# Patient Record
Sex: Male | Born: 1975 | Race: White | Hispanic: No | Marital: Single | Smoking: Former smoker
Health system: Southern US, Community
[De-identification: ages and names within clinical notes are randomized; demographics above are authoritative.]

## PROBLEM LIST (undated history)

## (undated) DIAGNOSIS — T7840XA Allergy, unspecified, initial encounter: Secondary | ICD-10-CM

## (undated) DIAGNOSIS — J45909 Unspecified asthma, uncomplicated: Secondary | ICD-10-CM

## (undated) DIAGNOSIS — J40 Bronchitis, not specified as acute or chronic: Secondary | ICD-10-CM

## (undated) DIAGNOSIS — J209 Acute bronchitis, unspecified: Secondary | ICD-10-CM

## (undated) HISTORY — DX: Unspecified asthma, uncomplicated: J45.909

## (undated) HISTORY — PX: DENTAL SURGERY: SHX609

## (undated) HISTORY — DX: Allergy, unspecified, initial encounter: T78.40XA

---

## 2008-11-12 ENCOUNTER — Emergency Department (HOSPITAL_COMMUNITY): Admission: EM | Admit: 2008-11-12 | Discharge: 2008-11-12 | Payer: Self-pay | Admitting: Emergency Medicine

## 2008-11-16 ENCOUNTER — Emergency Department (HOSPITAL_COMMUNITY): Admission: EM | Admit: 2008-11-16 | Discharge: 2008-11-16 | Payer: Self-pay | Admitting: Emergency Medicine

## 2008-12-06 ENCOUNTER — Emergency Department (HOSPITAL_COMMUNITY): Admission: EM | Admit: 2008-12-06 | Discharge: 2008-12-06 | Payer: Self-pay | Admitting: Emergency Medicine

## 2009-01-12 ENCOUNTER — Emergency Department: Payer: Self-pay | Admitting: Emergency Medicine

## 2009-07-07 ENCOUNTER — Emergency Department (HOSPITAL_BASED_OUTPATIENT_CLINIC_OR_DEPARTMENT_OTHER): Admission: EM | Admit: 2009-07-07 | Discharge: 2009-07-07 | Payer: Self-pay | Admitting: Emergency Medicine

## 2009-08-01 ENCOUNTER — Emergency Department (HOSPITAL_COMMUNITY): Admission: EM | Admit: 2009-08-01 | Discharge: 2009-08-01 | Payer: Self-pay | Admitting: Emergency Medicine

## 2009-08-30 ENCOUNTER — Emergency Department (HOSPITAL_COMMUNITY): Admission: EM | Admit: 2009-08-30 | Discharge: 2009-08-30 | Payer: Self-pay | Admitting: Emergency Medicine

## 2009-09-14 ENCOUNTER — Emergency Department (HOSPITAL_BASED_OUTPATIENT_CLINIC_OR_DEPARTMENT_OTHER): Admission: EM | Admit: 2009-09-14 | Discharge: 2009-09-14 | Payer: Self-pay | Admitting: Emergency Medicine

## 2010-12-13 LAB — URINE MICROSCOPIC-ADD ON

## 2010-12-13 LAB — URINALYSIS, ROUTINE W REFLEX MICROSCOPIC
Bilirubin Urine: NEGATIVE
Glucose, UA: NEGATIVE mg/dL
Ketones, ur: NEGATIVE mg/dL
Nitrite: NEGATIVE
Protein, ur: NEGATIVE mg/dL
pH: 8 (ref 5.0–8.0)

## 2010-12-13 LAB — RAPID URINE DRUG SCREEN, HOSP PERFORMED
Benzodiazepines: NOT DETECTED
Cocaine: POSITIVE — AB
Opiates: NOT DETECTED
Tetrahydrocannabinol: POSITIVE — AB

## 2011-02-16 ENCOUNTER — Emergency Department: Payer: Self-pay | Admitting: Emergency Medicine

## 2011-03-11 ENCOUNTER — Emergency Department: Payer: Self-pay | Admitting: Emergency Medicine

## 2011-05-15 ENCOUNTER — Emergency Department: Payer: Self-pay | Admitting: Emergency Medicine

## 2011-07-02 ENCOUNTER — Emergency Department (HOSPITAL_COMMUNITY): Payer: Self-pay

## 2011-07-02 ENCOUNTER — Emergency Department (HOSPITAL_COMMUNITY)
Admission: EM | Admit: 2011-07-02 | Discharge: 2011-07-02 | Disposition: A | Discharge status: Home / Self Care | Payer: Self-pay | Attending: Emergency Medicine | Admitting: Emergency Medicine

## 2011-07-02 DIAGNOSIS — M533 Sacrococcygeal disorders, not elsewhere classified: Secondary | ICD-10-CM | POA: Insufficient documentation

## 2011-07-02 DIAGNOSIS — T148XXA Other injury of unspecified body region, initial encounter: Secondary | ICD-10-CM | POA: Insufficient documentation

## 2011-07-02 DIAGNOSIS — W108XXA Fall (on) (from) other stairs and steps, initial encounter: Secondary | ICD-10-CM | POA: Insufficient documentation

## 2011-07-02 DIAGNOSIS — M545 Low back pain, unspecified: Secondary | ICD-10-CM | POA: Insufficient documentation

## 2013-11-17 ENCOUNTER — Emergency Department: Payer: Self-pay | Admitting: Emergency Medicine

## 2018-04-09 ENCOUNTER — Emergency Department (HOSPITAL_COMMUNITY)
Admission: EM | Admit: 2018-04-09 | Discharge: 2018-04-09 | Disposition: A | Discharge status: Home / Self Care | Payer: Self-pay | Attending: Emergency Medicine | Admitting: Emergency Medicine

## 2018-04-09 ENCOUNTER — Encounter (HOSPITAL_COMMUNITY): Payer: Self-pay

## 2018-04-09 ENCOUNTER — Other Ambulatory Visit: Payer: Self-pay

## 2018-04-09 ENCOUNTER — Emergency Department (HOSPITAL_COMMUNITY): Payer: Self-pay

## 2018-04-09 DIAGNOSIS — Z79899 Other long term (current) drug therapy: Secondary | ICD-10-CM | POA: Insufficient documentation

## 2018-04-09 DIAGNOSIS — J069 Acute upper respiratory infection, unspecified: Secondary | ICD-10-CM | POA: Insufficient documentation

## 2018-04-09 DIAGNOSIS — B9789 Other viral agents as the cause of diseases classified elsewhere: Secondary | ICD-10-CM | POA: Insufficient documentation

## 2018-04-09 DIAGNOSIS — J9801 Acute bronchospasm: Secondary | ICD-10-CM | POA: Insufficient documentation

## 2018-04-09 DIAGNOSIS — Z87891 Personal history of nicotine dependence: Secondary | ICD-10-CM | POA: Insufficient documentation

## 2018-04-09 HISTORY — DX: Acute bronchitis, unspecified: J20.9

## 2018-04-09 MED ORDER — ALBUTEROL SULFATE (2.5 MG/3ML) 0.083% IN NEBU: 2.5000 mg | INHALATION_SOLUTION | Freq: Four times a day (QID) | RESPIRATORY_TRACT | 0 refills | Status: DC | PRN

## 2018-04-09 MED ORDER — ALBUTEROL SULFATE (2.5 MG/3ML) 0.083% IN NEBU: 5.0000 mg | INHALATION_SOLUTION | Freq: Once | RESPIRATORY_TRACT | Status: DC

## 2018-04-09 MED ORDER — IPRATROPIUM-ALBUTEROL 0.5-2.5 (3) MG/3ML IN SOLN
3.0000 mL | Freq: Once | RESPIRATORY_TRACT | Status: AC
  Administered 2018-04-09: 3 mL via RESPIRATORY_TRACT
  Filled 2018-04-09: qty 3

## 2018-04-09 MED ORDER — GUAIFENESIN-DM 100-10 MG/5ML PO SYRP: 5.0000 mL | ORAL_SOLUTION | ORAL | 0 refills | Status: DC | PRN

## 2018-04-09 MED ORDER — ALBUTEROL SULFATE (2.5 MG/3ML) 0.083% IN NEBU
2.5000 mg | INHALATION_SOLUTION | Freq: Once | RESPIRATORY_TRACT | Status: AC
  Administered 2018-04-09: 2.5 mg via RESPIRATORY_TRACT
  Filled 2018-04-09: qty 3

## 2018-04-09 MED ORDER — ALBUTEROL SULFATE HFA 108 (90 BASE) MCG/ACT IN AERS
2.0000 | INHALATION_SPRAY | RESPIRATORY_TRACT | Status: DC | PRN
  Administered 2018-04-09: 2 via RESPIRATORY_TRACT
  Filled 2018-04-09: qty 6.7

## 2018-04-09 MED ORDER — PREDNISONE 20 MG PO TABS: 40.0000 mg | ORAL_TABLET | Freq: Every day | ORAL | 0 refills | Status: DC

## 2018-04-09 MED ORDER — IPRATROPIUM BROMIDE 0.02 % IN SOLN: 0.5000 mg | Freq: Once | RESPIRATORY_TRACT | Status: DC

## 2018-04-09 NOTE — ED Triage Notes (Signed)
Pt has been to urgent care and has been havign wheezing for 2 weeks. Pt has finished azithromycin, prednisone, prescription strength robitussin, tessalon, and an inhaler. States it is not getting any better. Pt has inspiratory and expiratory wheezes throughout. NAD.

## 2018-04-09 NOTE — ED Provider Notes (Signed)
[facility] EMERGENCY DEPARTMENT Provider Note   CSN: [id number] Arrival date & time: 04/09/18  1637     History   Chief Complaint Chief Complaint  Patient presents with  . Wheezing    HPI Edwin Nguyen is a 42 y.o. male.  HPI Patient presents with shortness of breath and wheezing.  Has had over the last month.  Has had steroids albuterol cough medicine and azithromycin around 2 weeks ago.  Had been a little better at times but then worsened again.  No fevers or chills.  Has a cough with some chest pain.  No sputum production. Past Medical History:  Diagnosis Date  . Acute bronchitis     There are no active problems to display for this patient.   History reviewed. No pertinent surgical history.      Home Medications    Prior to Admission medications   Medication Sig Start Date End Date Taking? Authorizing Provider  albuterol (PROVENTIL) (2.5 MG/3ML) 0.083% nebulizer solution Take 3 mLs (2.5 mg total) by nebulization every 6 (six) hours as needed for wheezing or shortness of breath. 04/09/18   Davonna Belling, MD  guaiFENesin-dextromethorphan (ROBITUSSIN DM) 100-10 MG/5ML syrup Take 5 mLs by mouth every 4 (four) hours as needed for cough. 04/09/18   Davonna Belling, MD  predniSONE (DELTASONE) 20 MG tablet Take 2 tablets (40 mg total) by mouth daily. 04/09/18   Davonna Belling, MD    Family History No family history on file.  Social History Social History   Tobacco Use  . Smoking status: Former Smoker    Last attempt to quit: 03/10/2018    Years since quitting: 0.0  . Smokeless tobacco: Never Used  Substance Use Topics  . Alcohol use: Not Currently    Frequency: Never  . Drug use: Not Currently     Allergies   Patient has no allergy information on record.   Review of Systems Review of Systems  Constitutional: Negative for appetite change.  HENT: Negative for congestion.   Respiratory: Positive for cough, shortness of breath and wheezing.     Cardiovascular: Negative for chest pain.  Gastrointestinal: Negative for abdominal pain.  Genitourinary: Negative for flank pain.  Musculoskeletal: Negative for back pain.  Skin: Negative for wound.  Hematological: Negative for adenopathy.  Psychiatric/Behavioral: Negative for confusion.     Physical Exam Updated Vital Signs BP 118/84   Pulse 98   Temp 98 F (36.7 C) (Oral)   Resp 14   Ht 6\' 1"  (1.854 m)   Wt 88.5 kg (195 lb)   SpO2 94%   BMI 25.73 kg/m   Physical Exam  Constitutional: He appears well-developed.  HENT:  Head: Normocephalic.  Neck: Neck supple.  Cardiovascular:  Mild tachycardia  Pulmonary/Chest:  Diffuse wheezes and prolonged expirations.  No focal rales or rhonchi.  Abdominal: Soft. There is no tenderness.  Musculoskeletal: He exhibits no edema.  Neurological: He is alert.  Skin: Skin is warm.     ED Treatments / Results  Labs (all labs ordered are listed, but only abnormal results are displayed) Labs Reviewed - No data to display  EKG None  Radiology Dg Chest 2 View  Result Date: 04/09/2018 CLINICAL DATA:  Cough and wheezing EXAM: CHEST - 2 VIEW COMPARISON:  11/17/2013 FINDINGS: The heart size and mediastinal contours are within normal limits. Both lungs are clear. The visualized skeletal structures are unremarkable. IMPRESSION: No active cardiopulmonary disease. Electronically Signed   By: Inez Catalina M.D.   On:  04/09/2018 18:23    Procedures Procedures (including critical care time)  Medications Ordered in ED Medications  albuterol (PROVENTIL HFA;VENTOLIN HFA) 108 (90 Base) MCG/ACT inhaler 2 puff (2 puffs Inhalation Given 04/09/18 2225)  ipratropium-albuterol (DUONEB) 0.5-2.5 (3) MG/3ML nebulizer solution 3 mL (3 mLs Nebulization Given 04/09/18 2034)  albuterol (PROVENTIL) (2.5 MG/3ML) 0.083% nebulizer solution 2.5 mg (2.5 mg Nebulization Given 04/09/18 2034)     Initial Impression / Assessment and Plan / ED Course  I have reviewed  the triage vital signs and the nursing notes.  Pertinent labs & imaging results that were available during my care of the patient were reviewed by me and considered in my medical decision making (see chart for details).     Patient with shortness of breath and cough.  May have component of COPD with his previous smoking history.  X-ray reassuring.  Feels better after nebulizer.  Will give prescription for nebulizer fluid.  Also will give 4-day course of steroids along with cough medicine.  May benefit from pulmonology follow-up.  Discharge home.  Final Clinical Impressions(s) / ED Diagnoses   Final diagnoses:  Bronchospasm  Viral URI with cough    ED Discharge Orders        Ordered    predniSONE (DELTASONE) 20 MG tablet  Daily     04/09/18 2223    albuterol (PROVENTIL) (2.5 MG/3ML) 0.083% nebulizer solution  Every 6 hours PRN     04/09/18 2223    guaiFENesin-dextromethorphan (ROBITUSSIN DM) 100-10 MG/5ML syrup  Every 4 hours PRN     04/09/18 2223       Davonna Belling, MD 04/09/18 2324

## 2018-04-16 ENCOUNTER — Other Ambulatory Visit: Payer: Self-pay

## 2018-04-16 ENCOUNTER — Encounter: Payer: Self-pay | Admitting: Emergency Medicine

## 2018-04-16 ENCOUNTER — Emergency Department: Payer: Self-pay

## 2018-04-16 ENCOUNTER — Inpatient Hospital Stay
Admission: EM | Admit: 2018-04-16 | Discharge: 2018-04-17 | DRG: 194 | Disposition: A | Discharge status: Home / Self Care | Payer: Self-pay | Attending: Internal Medicine | Admitting: Internal Medicine

## 2018-04-16 DIAGNOSIS — Z79899 Other long term (current) drug therapy: Secondary | ICD-10-CM

## 2018-04-16 DIAGNOSIS — Z87891 Personal history of nicotine dependence: Secondary | ICD-10-CM

## 2018-04-16 DIAGNOSIS — J189 Pneumonia, unspecified organism: Principal | ICD-10-CM | POA: Diagnosis present

## 2018-04-16 DIAGNOSIS — J441 Chronic obstructive pulmonary disease with (acute) exacerbation: Secondary | ICD-10-CM | POA: Diagnosis present

## 2018-04-16 DIAGNOSIS — Z23 Encounter for immunization: Secondary | ICD-10-CM

## 2018-04-16 DIAGNOSIS — Z7952 Long term (current) use of systemic steroids: Secondary | ICD-10-CM

## 2018-04-16 DIAGNOSIS — J440 Chronic obstructive pulmonary disease with acute lower respiratory infection: Secondary | ICD-10-CM | POA: Diagnosis present

## 2018-04-16 HISTORY — DX: Bronchitis, not specified as acute or chronic: J40

## 2018-04-16 LAB — CBC WITH DIFFERENTIAL/PLATELET
BASOS ABS: 0 10*3/uL (ref 0–0.1)
Basophils Relative: 1 %
EOS PCT: 13 %
Eosinophils Absolute: 1.2 10*3/uL — ABNORMAL HIGH (ref 0–0.7)
HEMATOCRIT: 41 % (ref 40.0–52.0)
Hemoglobin: 14 g/dL (ref 13.0–18.0)
LYMPHS ABS: 1.3 10*3/uL (ref 1.0–3.6)
LYMPHS PCT: 16 %
MCH: 30.2 pg (ref 26.0–34.0)
MCHC: 34.2 g/dL (ref 32.0–36.0)
MCV: 88.2 fL (ref 80.0–100.0)
Monocytes Absolute: 0.6 10*3/uL (ref 0.2–1.0)
Monocytes Relative: 7 %
NEUTROS ABS: 5.5 10*3/uL (ref 1.4–6.5)
Neutrophils Relative %: 63 %
PLATELETS: 259 10*3/uL (ref 150–440)
RBC: 4.64 MIL/uL (ref 4.40–5.90)
RDW: 13.7 % (ref 11.5–14.5)
WBC: 8.7 10*3/uL (ref 3.8–10.6)

## 2018-04-16 LAB — COMPREHENSIVE METABOLIC PANEL
ALBUMIN: 3.5 g/dL (ref 3.5–5.0)
ALT: 23 U/L (ref 0–44)
ANION GAP: 8 (ref 5–15)
AST: 28 U/L (ref 15–41)
Alkaline Phosphatase: 64 U/L (ref 38–126)
BUN: 12 mg/dL (ref 6–20)
CHLORIDE: 107 mmol/L (ref 98–111)
CO2: 25 mmol/L (ref 22–32)
Calcium: 8.5 mg/dL — ABNORMAL LOW (ref 8.9–10.3)
Creatinine, Ser: 1.17 mg/dL (ref 0.61–1.24)
GFR calc Af Amer: >60 mL/min (ref >60)
GFR calc non Af Amer: >60 mL/min (ref >60)
GLUCOSE: 112 mg/dL — AB (ref 70–99)
POTASSIUM: 4.4 mmol/L (ref 3.5–5.1)
SODIUM: 140 mmol/L (ref 135–145)
TOTAL PROTEIN: 6.2 g/dL — AB (ref 6.5–8.1)
Total Bilirubin: 0.7 mg/dL (ref 0.3–1.2)

## 2018-04-16 LAB — BRAIN NATRIURETIC PEPTIDE: B Natriuretic Peptide: 46 pg/mL (ref 0.0–100.0)

## 2018-04-16 LAB — TROPONIN I
Troponin I: <0.03 ng/mL (ref <0.03)
Troponin I: <0.03 ng/mL (ref <0.03)

## 2018-04-16 MED ORDER — OXYCODONE HCL 5 MG PO TABS
5.0000 mg | ORAL_TABLET | Freq: Four times a day (QID) | ORAL | Status: DC | PRN
  Administered 2018-04-16 – 2018-04-17 (×4): 5 mg via ORAL
  Filled 2018-04-16 (×4): qty 1

## 2018-04-16 MED ORDER — ENOXAPARIN SODIUM 40 MG/0.4ML ~~LOC~~ SOLN
40.0000 mg | SUBCUTANEOUS | Status: DC
  Administered 2018-04-16 – 2018-04-17 (×2): 40 mg via SUBCUTANEOUS
  Filled 2018-04-16 (×2): qty 0.4

## 2018-04-16 MED ORDER — CEFTRIAXONE SODIUM 1 G IJ SOLR
1.0000 g | INTRAMUSCULAR | Status: DC
  Administered 2018-04-17: 1 g via INTRAVENOUS
  Filled 2018-04-16: qty 1

## 2018-04-16 MED ORDER — ONDANSETRON HCL 4 MG/2ML IJ SOLN: 4.0000 mg | Freq: Four times a day (QID) | INTRAMUSCULAR | Status: DC | PRN

## 2018-04-16 MED ORDER — IPRATROPIUM-ALBUTEROL 0.5-2.5 (3) MG/3ML IN SOLN
3.0000 mL | Freq: Once | RESPIRATORY_TRACT | Status: AC
  Administered 2018-04-16: 3 mL via RESPIRATORY_TRACT
  Filled 2018-04-16: qty 3

## 2018-04-16 MED ORDER — ACETAMINOPHEN 325 MG PO TABS: 650.0000 mg | ORAL_TABLET | Freq: Four times a day (QID) | ORAL | Status: DC | PRN

## 2018-04-16 MED ORDER — PNEUMOCOCCAL VAC POLYVALENT 25 MCG/0.5ML IJ INJ
0.5000 mL | INJECTION | INTRAMUSCULAR | Status: AC
  Administered 2018-04-17: 0.5 mL via INTRAMUSCULAR
  Filled 2018-04-16: qty 0.5

## 2018-04-16 MED ORDER — IOPAMIDOL (ISOVUE-370) INJECTION 76%
75.0000 mL | Freq: Once | INTRAVENOUS | Status: AC | PRN
  Administered 2018-04-16: 75 mL via INTRAVENOUS

## 2018-04-16 MED ORDER — AZITHROMYCIN 250 MG PO TABS
250.0000 mg | ORAL_TABLET | Freq: Every day | ORAL | Status: DC
  Administered 2018-04-17: 250 mg via ORAL
  Filled 2018-04-16: qty 1

## 2018-04-16 MED ORDER — FLUTICASONE PROPIONATE 50 MCG/ACT NA SUSP
2.0000 | Freq: Every day | NASAL | Status: DC
  Administered 2018-04-16 – 2018-04-17 (×2): 2 via NASAL
  Filled 2018-04-16: qty 16

## 2018-04-16 MED ORDER — METHYLPREDNISOLONE SODIUM SUCC 40 MG IJ SOLR
40.0000 mg | Freq: Two times a day (BID) | INTRAMUSCULAR | Status: DC
  Administered 2018-04-16 – 2018-04-17 (×2): 40 mg via INTRAVENOUS
  Filled 2018-04-16 (×2): qty 1

## 2018-04-16 MED ORDER — MAGNESIUM SULFATE 2 GM/50ML IV SOLN
2.0000 g | Freq: Once | INTRAVENOUS | Status: AC
  Administered 2018-04-16: 2 g via INTRAVENOUS
  Filled 2018-04-16: qty 50

## 2018-04-16 MED ORDER — IPRATROPIUM-ALBUTEROL 0.5-2.5 (3) MG/3ML IN SOLN
3.0000 mL | Freq: Four times a day (QID) | RESPIRATORY_TRACT | Status: DC
  Administered 2018-04-16 – 2018-04-17 (×4): 3 mL via RESPIRATORY_TRACT
  Filled 2018-04-16 (×4): qty 3

## 2018-04-16 MED ORDER — METHYLPREDNISOLONE SODIUM SUCC 125 MG IJ SOLR
125.0000 mg | Freq: Once | INTRAMUSCULAR | Status: AC
  Administered 2018-04-16: 125 mg via INTRAVENOUS
  Filled 2018-04-16: qty 2

## 2018-04-16 MED ORDER — GUAIFENESIN-DM 100-10 MG/5ML PO SYRP
5.0000 mL | ORAL_SOLUTION | ORAL | Status: DC | PRN
  Administered 2018-04-16 – 2018-04-17 (×4): 5 mL via ORAL
  Filled 2018-04-16 (×5): qty 5

## 2018-04-16 MED ORDER — SODIUM CHLORIDE 0.9 % IV SOLN
500.0000 mg | Freq: Once | INTRAVENOUS | Status: AC
  Administered 2018-04-16: 500 mg via INTRAVENOUS
  Filled 2018-04-16: qty 500

## 2018-04-16 MED ORDER — ACETAMINOPHEN 650 MG RE SUPP: 650.0000 mg | Freq: Four times a day (QID) | RECTAL | Status: DC | PRN

## 2018-04-16 MED ORDER — SODIUM CHLORIDE 0.9 % IV SOLN
1.0000 g | Freq: Once | INTRAVENOUS | Status: AC
  Administered 2018-04-16: 1 g via INTRAVENOUS
  Filled 2018-04-16: qty 10

## 2018-04-16 MED ORDER — ONDANSETRON HCL 4 MG PO TABS: 4.0000 mg | ORAL_TABLET | Freq: Four times a day (QID) | ORAL | Status: DC | PRN

## 2018-04-16 MED ORDER — BUDESONIDE 0.5 MG/2ML IN SUSP
0.5000 mg | Freq: Two times a day (BID) | RESPIRATORY_TRACT | Status: DC
  Administered 2018-04-16 – 2018-04-17 (×2): 0.5 mg via RESPIRATORY_TRACT
  Filled 2018-04-16 (×4): qty 2

## 2018-04-16 MED ORDER — SODIUM CHLORIDE 0.9 % IV BOLUS
1000.0000 mL | Freq: Once | INTRAVENOUS | Status: AC
  Administered 2018-04-16: 1000 mL via INTRAVENOUS

## 2018-04-16 NOTE — ED Provider Notes (Addendum)
[facility] Emergency Department Provider Note  ____________________________________________   First MD Initiated Contact with Patient 04/16/18 [phone]     (approximate)  I have reviewed the triage vital signs and the nursing notes.   HISTORY  Chief Complaint Cough and Shortness of Breath   HPI Edwin Nguyen is a 42 y.o. male who self presents to the emergency department with 3 weeks of cough and shortness of breath.  He is a cigarette smoker however he stopped smoking 4 weeks ago.  Over the course the last 3 weeks he has had no fever but has had chills several times.  He is completed multiple courses of antibiotics and prednisone for "bronchitis".  He comes to the emergency department today with persistent and worsening symptoms.  He has moderate severity persistent cough worse in the morning.  He has shortness of breath it is worse with exertion.  He normally sleeps on 2 pillows but recently has been sleeping on 3.  No leg swelling.  No change in his weight.  No medications at baseline.  Nothing seems to make his symptoms better or worse.  He has had one chest x-ray but no cross-sectional imaging.    Past Medical History:  Diagnosis Date  . Acute bronchitis     There are no active problems to display for this patient.   History reviewed. No pertinent surgical history.  Prior to Admission medications   Medication Sig Start Date End Date Taking? Authorizing Provider  albuterol (PROVENTIL) (2.5 MG/3ML) 0.083% nebulizer solution Take 3 mLs (2.5 mg total) by nebulization every 6 (six) hours as needed for wheezing or shortness of breath. 04/09/18   Davonna Belling, MD  guaiFENesin-dextromethorphan (ROBITUSSIN DM) 100-10 MG/5ML syrup Take 5 mLs by mouth every 4 (four) hours as needed for cough. 04/09/18   Davonna Belling, MD  predniSONE (DELTASONE) 20 MG tablet Take 2 tablets (40 mg total) by mouth daily. 04/09/18   Davonna Belling, MD    Allergies Patient  has no known allergies.  History reviewed. No pertinent family history.  Social History Social History   Tobacco Use  . Smoking status: Former Smoker    Last attempt to quit: 03/10/2018    Years since quitting: 0.1  . Smokeless tobacco: Never Used  Substance Use Topics  . Alcohol use: Not Currently    Frequency: Never  . Drug use: Not Currently    Review of Systems Constitutional: Negative for fever positive for chills Eyes: No visual changes. ENT: No sore throat. Cardiovascular: Positive for chest pain. Respiratory: Positive for shortness of breath. Gastrointestinal: No abdominal pain.  No nausea, no vomiting.  No diarrhea.  No constipation. Genitourinary: Negative for dysuria. Musculoskeletal: Negative for back pain. Skin: Negative for rash. Neurological: Negative for headaches, focal weakness or numbness.   ____________________________________________   PHYSICAL EXAM:  VITAL SIGNS: ED Triage Vitals  Enc Vitals Group     BP 04/16/18 0648 129/81     Pulse Rate 04/16/18 0648 (!) 106     Resp 04/16/18 0648 20     Temp 04/16/18 0648 97.9 F (36.6 C)     Temp Source 04/16/18 0648 Oral     SpO2 04/16/18 0648 100 %     Weight 04/16/18 0649 195 lb (88.5 kg)     Height 04/16/18 0649 6\' 1"  (1.854 m)     Head Circumference --      Peak Flow --      Pain Score 04/16/18 0649 8  Pain Loc --      Pain Edu? --      Excl. in [place]? --     Constitutional: Alert and oriented x4 obviously short of breath speaking in 4-5 word sentences Eyes: PERRL EOMI. Head: Atraumatic. Nose: No congestion/rhinnorhea. Mouth/Throat: No trismus Neck: No stridor.  Moderate JVD although he is able to lie completely flat Cardiovascular: Tachycardic rate, regular rhythm. Grossly normal heart sounds.  Good peripheral circulation. Respiratory: Increased respiratory effort using mild accessory muscles with inspiratory and expiratory wheezing in all fields although worse in the lower fields.   Prolonged expiratory phase and moving good air Gastrointestinal: Soft nontender Musculoskeletal: No lower extremity edema legs equal in size Neurologic:  Normal speech and language. No gross focal neurologic deficits are appreciated. Skin:  Skin is warm, dry and intact. No rash noted. Psychiatric: Mood and affect are normal. Speech and behavior are normal.    ____________________________________________   DIFFERENTIAL includes but not limited to  Congestive heart failure, pulmonary embolism, lung cancer, viral bronchitis, bacterial bronchitis, pneumothorax, pneumonia ____________________________________________   LABS (all labs ordered are listed, but only abnormal results are displayed)  Labs Reviewed  COMPREHENSIVE METABOLIC PANEL - Abnormal; Notable for the following components:      Result Value   Glucose, Bld 112 (*)    Calcium 8.5 (*)    Total Protein 6.2 (*)    All other components within normal limits  CBC WITH DIFFERENTIAL/PLATELET - Abnormal; Notable for the following components:   Eosinophils Absolute 1.2 (*)    All other components within normal limits  TROPONIN I  BRAIN NATRIURETIC PEPTIDE    Lab work reviewed by me with no signs of acute ischemia __________________________________________  EKG  ED ECG REPORT I, Darel Hong, the attending physician, personally viewed and interpreted this ECG.  Date: 04/16/2018 EKG Time:  Rate: 97 Rhythm: normal sinus rhythm QRS Axis: normal Intervals: normal ST/T Wave abnormalities: normal Narrative Interpretation: no evidence of acute ischemia  ____________________________________________  RADIOLOGY  CT angiogram of the chest reviewed by me with no clot but is concerning for bilateral infiltrates ____________________________________________   PROCEDURES  Procedure(s) performed: no  .Critical Care Performed by: Darel Hong, MD Authorized by: Darel Hong, MD   Critical care provider statement:      Critical care time (minutes):  30   Critical care time was exclusive of:  Separately billable procedures and treating other patients   Critical care was necessary to treat or prevent imminent or life-threatening deterioration of the following conditions:  Respiratory failure   Critical care was time spent personally by me on the following activities:  Development of treatment plan with patient or surrogate, discussions with consultants, evaluation of patient's response to treatment, examination of patient, obtaining history from patient or surrogate, ordering and performing treatments and interventions, ordering and review of laboratory studies, ordering and review of radiographic studies, pulse oximetry, re-evaluation of patient's condition and review of old charts    Critical Care performed: no  ____________________________________________   INITIAL IMPRESSION / ASSESSMENT AND PLAN / ED COURSE  Pertinent labs & imaging results that were available during my care of the patient were reviewed by me and considered in my medical decision making (see chart for details).   As part of my medical decision making, I reviewed the following data within the [place] History obtained from family if available, nursing notes, old chart and ekg, as well as notes from prior ED visits.  The patient has 3  weeks of persistent cough and shortness of breath that is refractory to a azithromycin and prednisone.  He does have JVD although he is able to lie completely flat.  He is a smoker.  At this point I am concerned that his cough could be secondary to COPD versus pulmonary embolism versus heart failure versus malignancy and not actually just bronchitis.  We will give him 3 DuoNeb's now, Solu-Medrol, magnesium, however he will require a CT scan of his chest to fully elucidate the etiology of the symptoms.  Beta natruretic peptide added onto lab work.     ----------------------------------------- 9:08 AM on 04/16/2018 -----------------------------------------  After 3 duo nebs the patient saturation is now down to 92% on room air and he persists with significant wheezing and shortness of breath.  CT angiogram is negative for clot but is concerning for bilateral infiltrate.  At this point the patient has failed outpatient management with multiple doses of antibiotics and steroids and he requires inpatient admission for IV fluids, IV antibiotics, and for continued management of his respiratory status.  It is certainly possible that he has undiagnosed COPD and this can be evaluated as an inpatient as well.  ____________________________________________   FINAL CLINICAL IMPRESSION(S) / ED DIAGNOSES  Final diagnoses:  Community acquired pneumonia, unspecified laterality      NEW MEDICATIONS STARTED DURING THIS VISIT:  New Prescriptions   No medications on file     Note:  This document was prepared using Dragon voice recognition software and may include unintentional dictation errors.     Darel Hong, MD 04/16/18 2820    Darel Hong, MD 04/16/18 [place], Tomesha Sargent, MD 04/24/18 [phone]

## 2018-04-16 NOTE — H&P (Addendum)
[facility] [place] NAME: Edwin Nguyen    MR#:  [id number]  DATE OF BIRTH:  Apr 28, 1976  DATE OF ADMISSION:  04/16/2018  PRIMARY CARE PHYSICIAN: System, Provider Not In   REQUESTING/REFERRING PHYSICIAN: Dr Darel Hong  CHIEF COMPLAINT:   Chief Complaint  Patient presents with  . Cough  . Shortness of Breath    HISTORY OF PRESENT ILLNESS:  Edwin Nguyen  is a 42 y.o. male with a known history of bronchitis presents with 1 month of wheezing and chest tightness.  He states the chest tightness is been pretty constant and worse with coughing.  He has been wheezing continuously.  He has been short of breath and waking up at night short of breath.  He is coughing up clear phlegm.  He feels like he cannot get any air.  He has been diagnosed with acute bronchitis and given antibiotics and steroids previously.  He quit smoking last month.  In the ER, he was found to have bilateral pneumonia and wheezing quite a bit.  Hospitalist services were contacted for further evaluation.  PAST MEDICAL HISTORY:   Past Medical History:  Diagnosis Date  . Acute bronchitis   . Bronchitis     PAST SURGICAL HISTORY:  History reviewed. No pertinent surgical history.  SOCIAL HISTORY:   Social History   Tobacco Use  . Smoking status: Former Smoker    Last attempt to quit: 03/10/2018    Years since quitting: 0.1  . Smokeless tobacco: Never Used  Substance Use Topics  . Alcohol use: Not Currently    Frequency: Never    FAMILY HISTORY:   Family History  Problem Relation Age of Onset  . Prostate cancer Father   . Colon cancer Father   . Colon cancer Brother     DRUG ALLERGIES:  No Known Allergies  REVIEW OF SYSTEMS:  CONSTITUTIONAL: No fever.  Positive for sweats.  No fatigue or weakness.  EYES: No blurred or double vision.  EARS, NOSE, AND THROAT: No tinnitus or ear pain. No sore throat RESPIRATORY:  positive for cough, shortness of  breath, and wheezing.  No hemoptysis.  CARDIOVASCULAR:  positive for chest pain.  No orthopnea, edema.  GASTROINTESTINAL:  occasional nausea, and abdominal pain. No blood in bowel movements.  No diarrhea or constipation.  No vomiting. GENITOURINARY: No dysuria, hematuria.  ENDOCRINE: No polyuria, nocturia,  HEMATOLOGY: No anemia, easy bruising or bleeding SKIN: No rash or lesion. MUSCULOSKELETAL: No joint pain or arthritis.   NEUROLOGIC: No tingling, numbness, weakness.  PSYCHIATRY: No anxiety or depression.   MEDICATIONS AT HOME:   Prior to Admission medications   Medication Sig Start Date End Date Taking? Authorizing Provider  albuterol (PROVENTIL) (2.5 MG/3ML) 0.083% nebulizer solution Take 3 mLs (2.5 mg total) by nebulization every 6 (six) hours as needed for wheezing or shortness of breath. 04/09/18   Davonna Belling, MD  guaiFENesin-dextromethorphan (ROBITUSSIN DM) 100-10 MG/5ML syrup Take 5 mLs by mouth every 4 (four) hours as needed for cough. 04/09/18   Davonna Belling, MD  predniSONE (DELTASONE) 20 MG tablet Take 2 tablets (40 mg total) by mouth daily. Patient not taking: Reported on 04/16/2018 04/09/18   Davonna Belling, MD      VITAL SIGNS:  Blood pressure 129/81, pulse (!) 106, temperature 97.9 F (36.6 C), temperature source Oral, resp. rate 20, height 6\' 1"  (1.854 m), weight 88.5 kg (195 lb), SpO2 96 %.  PHYSICAL EXAMINATION:  GENERAL:  42 y.o.-year-old  patient lying in the bed with no acute distress.  EYES: Pupils equal, round, reactive to light and accommodation. No scleral icterus. Extraocular muscles intact.  HEENT: Head atraumatic, normocephalic. Oropharynx and nasopharynx clear.  NECK:  Supple, no jugular venous distention. No thyroid enlargement, no tenderness.  LUNGS:  decreased breath sounds bilaterally, positive wheezing throughout entire lung field.  No rales,rhonchi or crepitation. No use of accessory muscles of respiration.  CARDIOVASCULAR: S1, S2 normal.  No murmurs, rubs, or gallops.  ABDOMEN: Soft, nontender, nondistended. Bowel sounds present. No organomegaly or mass.  EXTREMITIES: No pedal edema, cyanosis, or clubbing.  NEUROLOGIC: Cranial nerves II through XII are intact. Muscle strength 5/5 in all extremities. Sensation intact. Gait not checked.  PSYCHIATRIC: The patient is alert and oriented x 3.  SKIN: No rash, lesion, or ulcer.   LABORATORY PANEL:   CBC Recent Labs  Lab 04/16/18 0724  WBC 8.7  HGB 14.0  HCT 41.0  PLT 259   ------------------------------------------------------------------------------------------------------------------  Chemistries  Recent Labs  Lab 04/16/18 0724  NA 140  K 4.4  CL 107  CO2 25  GLUCOSE 112*  BUN 12  CREATININE 1.17  CALCIUM 8.5*  AST 28  ALT 23  ALKPHOS 64  BILITOT 0.7   ------------------------------------------------------------------------------------------------------------------  Cardiac Enzymes Recent Labs  Lab 04/16/18 0724  TROPONINI <0.03   ------------------------------------------------------------------------------------------------------------------  RADIOLOGY:  Ct Angio Chest Pe W/cm &/or Wo Cm  Result Date: 04/16/2018 CLINICAL DATA:  Shortness of breath and cough. EXAM: CT ANGIOGRAPHY CHEST WITH CONTRAST TECHNIQUE: Multidetector CT imaging of the chest was performed using the standard protocol during bolus administration of intravenous contrast. Multiplanar CT image reconstructions and MIPs were obtained to evaluate the vascular anatomy. CONTRAST:  3mL ISOVUE-370 IOPAMIDOL (ISOVUE-370) INJECTION 76% COMPARISON:  Chest x-ray dated 04/16/2018 FINDINGS: Cardiovascular: Satisfactory opacification of the pulmonary arteries to the segmental level. No evidence of pulmonary embolism. Normal heart size. No pericardial effusion. Mediastinum/Nodes: Slightly prominent lymph nodes in both hilar regions, 15 mm on image 45 on series 4 in the right hilum and 11 mm in the left  hilum on image 51 series 4. Thyroid gland, trachea, and esophagus demonstrate no significant findings. Lungs/Pleura: Multiple small patchy areas of infiltrate bilaterally. Bilateral peribronchial thickening. No effusions. Upper Abdomen: Normal. Musculoskeletal: No chest wall abnormality. No acute or significant osseous findings. Review of the MIP images confirms the above findings. IMPRESSION: 1. No pulmonary emboli. 2. Multiple small patchy areas of infiltrate bilaterally. 3. Bronchitic changes. Electronically Signed   By: Lorriane Shire M.D.   On: 04/16/2018 08:49   Dg Chest Port 1 View  Result Date: 04/16/2018 CLINICAL DATA:  Shortness of breath EXAM: PORTABLE CHEST 1 VIEW COMPARISON:  April 09, 2018 FINDINGS: There is no edema or consolidation. The heart size and pulmonary vascularity are normal. No adenopathy. No bone lesions. IMPRESSION: No edema or consolidation. Electronically Signed   By: Lowella Grip III M.D.   On: 04/16/2018 07:27    EKG:   Normal sinus rhythm 97 bpm.  No acute ST-T wave changes.  IMPRESSION AND PLAN:   1.  Bilateral pneumonia and COPD exacerbation.   start DuoNeb nebulizer solution and budesonide nebulizers.  Continue Solu-Medrol 40 mg IV every 12 hours antibiotics Rocephin and Zithromax.  Monitor daily.  Patient still wheezing quite a bit and likely will need a few days in order to break down the inflammation.  The patient quit smoking 1 month ago.  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  They have made an appointment with pulmonary as outpatient.   2.  Chest tightness likely secondary to pneumonia and COPD exacerbation.  Likely a pleuritic type pain.  Steroid should help this.  Check cardiac enzymes and monitor on telemetry. 3.  Family history of colon cancer in his father and brother.  Brother died of this.  Recommend colonoscopy screening as outpatient once he is breathing better.   All the  records are reviewed and case discussed with ED provider. Management plans discussed with the patient, family and they are in agreement.  CODE STATUS: Full code  TOTAL TIME TAKING CARE OF THIS PATIENT: 50 minutes.    Loletha Grayer M.D on 04/16/2018 at 9:44 AM  Between 7am to 6pm - Pager - [phone]  After 6pm call admission pager [phone]  [facility]  [phone]  CC: Primary care physician; System, Provider Not In

## 2018-04-16 NOTE — ED Triage Notes (Signed)
Pt presents to ED with persistent cough and shortness of breath. Pt states his symptoms have been ongoing for about 3 weeks but worsened between 2 and 4 am. Pt did one breathing tx at home around 5am with no relief. Last Wednesday was seen at [facility] ED for the same and was given prednisone and inhaler. Inhaler is now empty and he never took the prednisone because the previous prescription he received didn't help and just made him feel "jittery". pt states he coughs every time he takes a deep breath and the cough makes his chest sore and head hurt. Pt states he has never had a cough this bad.

## 2018-04-16 NOTE — ED Notes (Signed)
Primary assessment was performed prior to breathing treatment - at that time pt lungs sounded diminished/tight with little air flow noted

## 2018-04-16 NOTE — ED Notes (Signed)
MD at the bedside for pt evaluation  

## 2018-04-16 NOTE — ED Notes (Signed)
Attempted to call report and was advised to call back in 5 minutes

## 2018-04-16 NOTE — Plan of Care (Signed)

## 2018-04-16 NOTE — ED Notes (Signed)
Pt being transported to rm [id number] at this time by this tech and Leah,EDT.

## 2018-04-17 ENCOUNTER — Institutional Professional Consult (permissible substitution): Payer: Self-pay | Admitting: Internal Medicine

## 2018-04-17 LAB — BASIC METABOLIC PANEL
Anion gap: 10 (ref 5–15)
BUN: 15 mg/dL (ref 6–20)
CALCIUM: 8.8 mg/dL — AB (ref 8.9–10.3)
CO2: 21 mmol/L — ABNORMAL LOW (ref 22–32)
CREATININE: 0.93 mg/dL (ref 0.61–1.24)
Chloride: 109 mmol/L (ref 98–111)
GFR calc Af Amer: >60 mL/min (ref >60)
GLUCOSE: 137 mg/dL — AB (ref 70–99)
Potassium: 4.3 mmol/L (ref 3.5–5.1)
Sodium: 140 mmol/L (ref 135–145)

## 2018-04-17 LAB — CBC
HCT: 40.5 % (ref 40.0–52.0)
HEMOGLOBIN: 13.8 g/dL (ref 13.0–18.0)
MCH: 30.1 pg (ref 26.0–34.0)
MCHC: 34.2 g/dL (ref 32.0–36.0)
MCV: 88.1 fL (ref 80.0–100.0)
PLATELETS: 309 10*3/uL (ref 150–440)
RBC: 4.6 MIL/uL (ref 4.40–5.90)
RDW: 13.6 % (ref 11.5–14.5)
WBC: 16.5 10*3/uL — ABNORMAL HIGH (ref 3.8–10.6)

## 2018-04-17 MED ORDER — AZITHROMYCIN 250 MG PO TABS: ORAL_TABLET | ORAL | 0 refills | Status: DC

## 2018-04-17 MED ORDER — AMOXICILLIN-POT CLAVULANATE 875-125 MG PO TABS: 1.0000 | ORAL_TABLET | Freq: Two times a day (BID) | ORAL | 0 refills | Status: AC

## 2018-04-17 MED ORDER — IPRATROPIUM-ALBUTEROL 0.5-2.5 (3) MG/3ML IN SOLN: 3.0000 mL | Freq: Four times a day (QID) | RESPIRATORY_TRACT | 0 refills | Status: DC

## 2018-04-17 MED ORDER — BECLOMETHASONE DIPROP HFA 40 MCG/ACT IN AERB: 2.0000 | INHALATION_SPRAY | Freq: Two times a day (BID) | RESPIRATORY_TRACT | 0 refills | Status: DC

## 2018-04-17 MED ORDER — PREDNISONE 10 MG PO TABS: ORAL_TABLET | ORAL | 0 refills | Status: DC

## 2018-04-17 MED ORDER — OXYCODONE HCL 5 MG PO TABS: 5.0000 mg | ORAL_TABLET | Freq: Four times a day (QID) | ORAL | 0 refills | Status: DC | PRN

## 2018-04-17 MED ORDER — IPRATROPIUM-ALBUTEROL 20-100 MCG/ACT IN AERS: 2.0000 | INHALATION_SPRAY | Freq: Four times a day (QID) | RESPIRATORY_TRACT | 0 refills | Status: DC

## 2018-04-17 NOTE — Progress Notes (Signed)
IV and tele removed from patient. Discharge instructions given to patient. Verbalized understanding. No acute distress at this time. Girlfriend to transport patient home.  

## 2018-04-17 NOTE — Discharge Instructions (Signed)

## 2018-04-17 NOTE — Discharge Summary (Signed)
[facility] NAME: Edwin Nguyen    MR#:  [id number]  DATE OF BIRTH:  04/27/1976  DATE OF ADMISSION:  04/16/2018 ADMITTING PHYSICIAN: Loletha Grayer, MD  DATE OF DISCHARGE: 04/17/2018 12:33 PM  PRIMARY CARE PHYSICIAN: System, Provider Not In    ADMISSION DIAGNOSIS:  Community acquired pneumonia, unspecified laterality [J18.9]  DISCHARGE DIAGNOSIS:  Active Problems:   Pneumonia   SECONDARY DIAGNOSIS:   Past Medical History:  Diagnosis Date  . Acute bronchitis   . Bronchitis     HOSPITAL COURSE:   1.  Bilateral pneumonia and COPD exacerbation versus asthmatic bronchitis.  I started DuoNeb nebulizer solution and budesonide nebulizers and I gave Solu-Medrol 40 mg IV every 12 hours and antibiotics Rocephin and Zithromax.  The patient had quite a bit of wheeze and bronchospasm when he came into the hospital but on the day of discharge he had improved quite a bit.  I was not expecting him to improve so quickly.  The patient quit smoking 1 month ago.  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  Family has made appointments with the PMD and pulmonary as outpatient. 2.  Chest tightness likely secondary to pneumonia and coughing.  I did prescribe a few pills of oxycodone.  The steroid should help this.  Cardiac enzymes negative. 3.  Family history of colon cancer in his father and brother.  Recommend colonoscopy screening as outpatient once breathing better.  DISCHARGE CONDITIONS:   Satisfactory  CONSULTS OBTAINED:  None  DRUG ALLERGIES:  No Known Allergies  DISCHARGE MEDICATIONS:   Allergies as of 04/17/2018   No Known Allergies     Medication List    STOP taking these medications   albuterol (2.5 MG/3ML) 0.083% nebulizer solution Commonly known as:  PROVENTIL   guaiFENesin-dextromethorphan 100-10 MG/5ML syrup Commonly known as:  ROBITUSSIN DM     TAKE these medications    amoxicillin-clavulanate 875-125 MG tablet Commonly known as:  AUGMENTIN Take 1 tablet by mouth 2 (two) times daily for 10 doses.   azithromycin 250 MG tablet Commonly known as:  ZITHROMAX One tab daily for three days Start taking on:  04/18/2018   beclomethasone 40 MCG/ACT inhaler Commonly known as:  QVAR Inhale 2 puffs into the lungs 2 (two) times daily.   Ipratropium-Albuterol 20-100 MCG/ACT Aers respimat Commonly known as:  COMBIVENT Inhale 2 puffs into the lungs every 6 (six) hours.   ipratropium-albuterol 0.5-2.5 (3) MG/3ML Soln Commonly known as:  DUONEB Take 3 mLs by nebulization every 6 (six) hours. Dx: J44.1   oxyCODONE 5 MG immediate release tablet Commonly known as:  Oxy IR/ROXICODONE Take 1 tablet (5 mg total) by mouth every 6 (six) hours as needed for severe pain.   predniSONE 10 MG tablet Commonly known as:  DELTASONE 4 tabs po daily for three more days What changed:    medication strength  how much to take  how to take this  when to take this  additional instructions        DISCHARGE INSTRUCTIONS:   Follow-up with doctors that he scheduled appointments with.  If you experience worsening of your admission symptoms, develop shortness of breath, life threatening emergency, suicidal or homicidal thoughts you must seek medical attention immediately by calling 911 or calling your MD immediately  if symptoms less severe.  You Must read complete instructions/literature along with all the possible adverse reactions/side effects for all the Medicines you  take and that have been prescribed to you. Take any new Medicines after you have completely understood and accept all the possible adverse reactions/side effects.   Please note  You were cared for by a hospitalist during your hospital stay. If you have any questions about your discharge medications or the care you received while you were in the hospital after you are discharged, you can call the unit and  asked to speak with the hospitalist on call if the hospitalist that took care of you is not available. Once you are discharged, your primary care physician will handle any further medical issues. Please note that NO REFILLS for any discharge medications will be authorized once you are discharged, as it is imperative that you return to your primary care physician (or establish a relationship with a primary care physician if you do not have one) for your aftercare needs so that they can reassess your need for medications and monitor your lab values.    Today   CHIEF COMPLAINT:   Chief Complaint  Patient presents with  . Cough  . Shortness of Breath    HISTORY OF PRESENT ILLNESS:  Edwin Nguyen  is a 42 y.o. male presented with cough and shortness of breath and wheezing   VITAL SIGNS:  Blood pressure 132/79, pulse 87, temperature (!) 97.5 F (36.4 C), temperature source Oral, resp. rate 18, height 6\' 1"  (1.854 m), weight 87.5 kg, SpO2 98 %.   PHYSICAL EXAMINATION:  GENERAL:  43 y.o.-year-old patient lying in the bed with no acute distress.  EYES: Pupils equal, round, reactive to light and accommodation. No scleral icterus. Extraocular muscles intact.  HEENT: Head atraumatic, normocephalic. Oropharynx and nasopharynx clear.  NECK:  Supple, no jugular venous distention. No thyroid enlargement, no tenderness.  LUNGS: decreased breath sounds bilaterally, no wheezing, rales,rhonchi or crepitation. No use of accessory muscles of respiration.  CARDIOVASCULAR: S1, S2 normal. No murmurs, rubs, or gallops.  ABDOMEN: Soft, non-tender, non-distended. Bowel sounds present. No organomegaly or mass.  EXTREMITIES: No pedal edema, cyanosis, or clubbing.  NEUROLOGIC: Cranial nerves II through XII are intact. Muscle strength 5/5 in all extremities. Sensation intact. Gait not checked.  PSYCHIATRIC: The patient is alert and oriented x 3.  SKIN: No obvious rash, lesion, or ulcer.   DATA REVIEW:    CBC Recent Labs  Lab 04/17/18 0438  WBC 16.5*  HGB 13.8  HCT 40.5  PLT 309    Chemistries  Recent Labs  Lab 04/16/18 0724 04/17/18 0438  NA 140 140  K 4.4 4.3  CL 107 109  CO2 25 21*  GLUCOSE 112* 137*  BUN 12 15  CREATININE 1.17 0.93  CALCIUM 8.5* 8.8*  AST 28  --   ALT 23  --   ALKPHOS 64  --   BILITOT 0.7  --     Cardiac Enzymes Recent Labs  Lab 04/16/18 1743  TROPONINI <0.03      RADIOLOGY:  Ct Angio Chest Pe W/cm &/or Wo Cm  Result Date: 04/16/2018 CLINICAL DATA:  Shortness of breath and cough. EXAM: CT ANGIOGRAPHY CHEST WITH CONTRAST TECHNIQUE: Multidetector CT imaging of the chest was performed using the standard protocol during bolus administration of intravenous contrast. Multiplanar CT image reconstructions and MIPs were obtained to evaluate the vascular anatomy. CONTRAST:  15mL ISOVUE-370 IOPAMIDOL (ISOVUE-370) INJECTION 76% COMPARISON:  Chest x-ray dated 04/16/2018 FINDINGS: Cardiovascular: Satisfactory opacification of the pulmonary arteries to the segmental level. No evidence of pulmonary embolism. Normal heart size. No pericardial  effusion. Mediastinum/Nodes: Slightly prominent lymph nodes in both hilar regions, 15 mm on image 45 on series 4 in the right hilum and 11 mm in the left hilum on image 51 series 4. Thyroid gland, trachea, and esophagus demonstrate no significant findings. Lungs/Pleura: Multiple small patchy areas of infiltrate bilaterally. Bilateral peribronchial thickening. No effusions. Upper Abdomen: Normal. Musculoskeletal: No chest wall abnormality. No acute or significant osseous findings. Review of the MIP images confirms the above findings. IMPRESSION: 1. No pulmonary emboli. 2. Multiple small patchy areas of infiltrate bilaterally. 3. Bronchitic changes. Electronically Signed   By: Lorriane Shire M.D.   On: 04/16/2018 08:49   Dg Chest Port 1 View  Result Date: 04/16/2018 CLINICAL DATA:  Shortness of breath EXAM: PORTABLE CHEST 1 VIEW  COMPARISON:  April 09, 2018 FINDINGS: There is no edema or consolidation. The heart size and pulmonary vascularity are normal. No adenopathy. No bone lesions. IMPRESSION: No edema or consolidation. Electronically Signed   By: Lowella Grip III M.D.   On: 04/16/2018 07:27     Management plans discussed with the patient, family and they are in agreement.  CODE STATUS:     Code Status Orders  (From admission, onward)         Start     Ordered   04/16/18 0942  Full code  Continuous     04/16/18 0942        Code Status History    This patient has a current code status but no historical code status.      TOTAL TIME TAKING CARE OF THIS PATIENT: 35 minutes.    Loletha Grayer M.D on 04/17/2018 at 2:28 PM  Between 7am to 6pm - Pager - [phone]  After 6pm go to [email or web address] - password [email or web address]  [facility] Office  [phone]  CC: Primary care physician; System, Provider Not In

## 2018-04-18 LAB — HIV ANTIBODY (ROUTINE TESTING W REFLEX): HIV SCREEN 4TH GENERATION: NONREACTIVE

## 2018-04-23 ENCOUNTER — Institutional Professional Consult (permissible substitution): Payer: Self-pay | Admitting: Internal Medicine

## 2018-04-24 ENCOUNTER — Encounter: Payer: Self-pay | Admitting: Internal Medicine

## 2018-06-05 ENCOUNTER — Emergency Department
Admission: EM | Admit: 2018-06-05 | Discharge: 2018-06-05 | Disposition: A | Discharge status: Home / Self Care | Payer: Self-pay | Attending: Emergency Medicine | Admitting: Emergency Medicine

## 2018-06-05 ENCOUNTER — Emergency Department: Payer: Self-pay

## 2018-06-05 ENCOUNTER — Encounter: Payer: Self-pay | Admitting: Emergency Medicine

## 2018-06-05 DIAGNOSIS — J410 Simple chronic bronchitis: Secondary | ICD-10-CM | POA: Insufficient documentation

## 2018-06-05 DIAGNOSIS — Z79899 Other long term (current) drug therapy: Secondary | ICD-10-CM | POA: Insufficient documentation

## 2018-06-05 DIAGNOSIS — R062 Wheezing: Secondary | ICD-10-CM | POA: Insufficient documentation

## 2018-06-05 DIAGNOSIS — Z87891 Personal history of nicotine dependence: Secondary | ICD-10-CM | POA: Insufficient documentation

## 2018-06-05 LAB — COMPREHENSIVE METABOLIC PANEL
ALBUMIN: 3.3 g/dL — AB (ref 3.5–5.0)
ALT: 21 U/L (ref 0–44)
ANION GAP: 5 (ref 5–15)
AST: 22 U/L (ref 15–41)
Alkaline Phosphatase: 54 U/L (ref 38–126)
BILIRUBIN TOTAL: 0.5 mg/dL (ref 0.3–1.2)
BUN: 13 mg/dL (ref 6–20)
CO2: 30 mmol/L (ref 22–32)
Calcium: 8.6 mg/dL — ABNORMAL LOW (ref 8.9–10.3)
Chloride: 107 mmol/L (ref 98–111)
Creatinine, Ser: 1 mg/dL (ref 0.61–1.24)
GFR calc Af Amer: >60 mL/min (ref >60)
GFR calc non Af Amer: >60 mL/min (ref >60)
GLUCOSE: 102 mg/dL — AB (ref 70–99)
POTASSIUM: 4.5 mmol/L (ref 3.5–5.1)
SODIUM: 142 mmol/L (ref 135–145)
TOTAL PROTEIN: 6.1 g/dL — AB (ref 6.5–8.1)

## 2018-06-05 LAB — CBC
HCT: 48.5 % (ref 40.0–52.0)
Hemoglobin: 16.7 g/dL (ref 13.0–18.0)
MCH: 30.4 pg (ref 26.0–34.0)
MCHC: 34.5 g/dL (ref 32.0–36.0)
MCV: 88 fL (ref 80.0–100.0)
Platelets: 266 10*3/uL (ref 150–440)
RBC: 5.52 MIL/uL (ref 4.40–5.90)
RDW: 14.4 % (ref 11.5–14.5)
WBC: 11.3 10*3/uL — ABNORMAL HIGH (ref 3.8–10.6)

## 2018-06-05 MED ORDER — ALBUTEROL SULFATE HFA 108 (90 BASE) MCG/ACT IN AERS: 2.0000 | INHALATION_SPRAY | Freq: Four times a day (QID) | RESPIRATORY_TRACT | 2 refills | Status: DC | PRN

## 2018-06-05 MED ORDER — DEXAMETHASONE SODIUM PHOSPHATE 10 MG/ML IJ SOLN
10.0000 mg | Freq: Once | INTRAMUSCULAR | Status: AC
  Administered 2018-06-05: 10 mg via INTRAMUSCULAR
  Filled 2018-06-05: qty 1

## 2018-06-05 MED ORDER — ALBUTEROL SULFATE (2.5 MG/3ML) 0.083% IN NEBU
5.0000 mg | INHALATION_SOLUTION | Freq: Once | RESPIRATORY_TRACT | Status: AC
  Administered 2018-06-05: 5 mg via RESPIRATORY_TRACT
  Filled 2018-06-05: qty 6

## 2018-06-05 NOTE — ED Triage Notes (Signed)
Pt reports that he has been getting short of breath when he does things and he used to not get that way.

## 2018-06-05 NOTE — ED Triage Notes (Signed)
Pt reports was admitted for pneumonia a week ago and is wheezing again and is concerned it is coming back.

## 2018-06-05 NOTE — ED Notes (Signed)
See triage note  States he developed some SOB and wheezing  No fever

## 2018-06-05 NOTE — ED Notes (Signed)
Patient in lobby awaiting room.  In nad.

## 2018-06-05 NOTE — ED Provider Notes (Signed)
[facility] Emergency Department Provider Note   ____________________________________________   First MD Initiated Contact with Patient 06/05/18 1318     (approximate)  I have reviewed the triage vital signs and the nursing notes.   HISTORY  Chief Complaint Wheezing    HPI Edwin Nguyen is a 42 y.o. male patient complain of shortness of breath with mild exertion.  Patient has a history of COPD which developed into pneumonia last week.  Patient state he finished antibiotics and felt better today went back to work.  Patient state his job heard him wheezing and sent into the ED for evaluation before he goes back to work.  Patient state he does not have an inhaler or any current medication for his condition.  Patient denies chest pain.  Past Medical History:  Diagnosis Date  . Acute bronchitis   . Bronchitis     Patient Active Problem List   Diagnosis Date Noted  . Pneumonia 04/16/2018    History reviewed. No pertinent surgical history.  Prior to Admission medications   Medication Sig Start Date End Date Taking? Authorizing Provider  albuterol (PROVENTIL HFA;VENTOLIN HFA) 108 (90 Base) MCG/ACT inhaler Inhale 2 puffs into the lungs every 6 (six) hours as needed for wheezing or shortness of breath. 06/05/18   Sable Feil, PA-C  azithromycin [facility]) 250 MG tablet One tab daily for three days 04/18/18   Loletha Grayer, MD  beclomethasone (QVAR REDIHALER) 40 MCG/ACT inhaler Inhale 2 puffs into the lungs 2 (two) times daily. 04/17/18   Loletha Grayer, MD  Ipratropium-Albuterol (COMBIVENT) 20-100 MCG/ACT AERS respimat Inhale 2 puffs into the lungs every 6 (six) hours. 04/17/18   Loletha Grayer, MD  ipratropium-albuterol (DUONEB) 0.5-2.5 (3) MG/3ML SOLN Take 3 mLs by nebulization every 6 (six) hours. Dx: J44.1 04/17/18   Loletha Grayer, MD  oxyCODONE (OXY IR/ROXICODONE) 5 MG immediate release tablet Take 1 tablet (5 mg total) by mouth every 6 (six)  hours as needed for severe pain. 04/17/18   Loletha Grayer, MD  predniSONE (DELTASONE) 10 MG tablet 4 tabs po daily for three more days 04/17/18   Loletha Grayer, MD    Allergies Patient has no known allergies.  Family History  Problem Relation Age of Onset  . Prostate cancer Father   . Colon cancer Father   . Colon cancer Brother     Social History Social History   Tobacco Use  . Smoking status: Former Smoker    Last attempt to quit: 03/10/2018    Years since quitting: 0.2  . Smokeless tobacco: Never Used  Substance Use Topics  . Alcohol use: Not Currently    Frequency: Never  . Drug use: Not Currently    Review of Systems  Constitutional: No fever/chills Eyes: No visual changes. ENT: No sore throat. Cardiovascular: Denies chest pain. Respiratory: Wheezing intermittent shortness of breath. Gastrointestinal: No abdominal pain.  No nausea, no vomiting.  No diarrhea.  No constipation. Genitourinary: Negative for dysuria. Musculoskeletal: Negative for back pain. Skin: Negative for rash. Neurological: Negative for headaches, focal weakness or numbness.   ____________________________________________   PHYSICAL EXAM:  VITAL SIGNS: ED Triage Vitals [06/05/18 1028]  Enc Vitals Group     BP      Pulse Rate (!) 56     Resp 20     Temp 98.1 F (36.7 C)     Temp Source Oral     SpO2 100 %     Weight 196 lb (88.9 kg)  Height 6' (1.829 m)     Head Circumference      Peak Flow      Pain Score 0     Pain Loc      Pain Edu?      Excl. in [place]?     Constitutional: Alert and oriented. Well appearing and in no acute distress. Neck: No stridor.  Cardiovascular: Normal rate, regular rhythm. Grossly normal heart sounds.  Good peripheral circulation. Respiratory: Normal respiratory effort.  No retractions. Lungs mild expiratory wheezing. Skin:  Skin is warm, dry and intact. No rash noted. Psychiatric: Mood and affect are normal. Speech and behavior are  normal.  ____________________________________________   LABS (all labs ordered are listed, but only abnormal results are displayed)  Labs Reviewed  CBC - Abnormal; Notable for the following components:      Result Value   WBC 11.3 (*)    All other components within normal limits  COMPREHENSIVE METABOLIC PANEL - Abnormal; Notable for the following components:   Glucose, Bld 102 (*)    Calcium 8.6 (*)    Total Protein 6.1 (*)    Albumin 3.3 (*)    All other components within normal limits   ____________________________________________  EKG   ____________________________________________  RADIOLOGY  ED MD interpretation:    Official radiology report(s): Dg Chest 2 View  Result Date: 06/05/2018 CLINICAL DATA:  Wheezing and shortness of breath. Diagnosed with pneumonia 1 week ago. Ex-smoker. EXAM: CHEST - 2 VIEW COMPARISON:  04/16/2018 and chest CTA dated 04/16/2018. FINDINGS: Normal sized heart. Clear lungs. The lungs remain hyperexpanded with mildly prominent interstitial markings. Unremarkable bones. IMPRESSION: Stable changes of COPD. No acute abnormality. Electronically Signed   By: Claudie Revering M.D.   On: 06/05/2018 12:48    ____________________________________________   PROCEDURES  Procedure(s) performed: None  Procedures  Critical Care performed: No  ____________________________________________   INITIAL IMPRESSION / ASSESSMENT AND PLAN / ED COURSE  As part of my medical decision making, I reviewed the following data within the electronic MEDICAL RECORD NUMBER    Wheezing and mild dyspnea secondary to COPD.  Discussed chest x-ray findings with patient.  Patient given discharge care instruction.  Patient is scheduled to see a new PCP tomorrow.  Patient given a prescription for Ventolin inhaler and a Medrol Dosepak.  Patient given a work note.      ____________________________________________   FINAL CLINICAL IMPRESSION(S) / ED DIAGNOSES  Final diagnoses:   Simple chronic bronchitis ([place])     ED Discharge Orders         Ordered    albuterol (PROVENTIL HFA;VENTOLIN HFA) 108 (90 Base) MCG/ACT inhaler  Every 6 hours PRN     06/05/18 1326           Note:  This document was prepared using Dragon voice recognition software and may include unintentional dictation errors.    Sable Feil, PA-C 06/05/18 1332    Earleen Newport, MD 06/05/18 [phone]

## 2018-06-05 NOTE — Discharge Instructions (Addendum)
Follow-up with new PCP for continued care .

## 2018-06-16 ENCOUNTER — Ambulatory Visit (INDEPENDENT_AMBULATORY_CARE_PROVIDER_SITE_OTHER): Payer: Managed Care, Other (non HMO) | Admitting: Internal Medicine

## 2018-06-16 ENCOUNTER — Encounter: Payer: Self-pay | Admitting: Internal Medicine

## 2018-06-16 VITALS — Resp 16 | Ht 72.0 in | Wt 203.0 lb

## 2018-06-16 DIAGNOSIS — J181 Lobar pneumonia, unspecified organism: Secondary | ICD-10-CM

## 2018-06-16 DIAGNOSIS — R0602 Shortness of breath: Secondary | ICD-10-CM | POA: Diagnosis not present

## 2018-06-16 MED ORDER — BUDESONIDE-FORMOTEROL FUMARATE 160-4.5 MCG/ACT IN AERO: 2.0000 | INHALATION_SPRAY | Freq: Two times a day (BID) | RESPIRATORY_TRACT | 12 refills | Status: DC

## 2018-06-16 NOTE — Patient Instructions (Addendum)
Change qvar to symbicort 2 puffs twice daily for one month, then can stop if feeling back to normal. Make sure to rinse mouth after use.   Do not eat 4 hours before bedtime. Avoid high fat food, especially in the evening.   Will start omeprazole 20 mg  (prilosec) once daily.

## 2018-06-16 NOTE — Progress Notes (Signed)
[facility] Consultation      Assessment and Plan:  Asthma --Asthmatic bronchitis, likely secondary to recent pneumonia and smoking at that time.  --Will change qvar to symbicort 2 puffs bid.   Recent pneumonia.  - Appears to be undergoing a slow recovery, will continue to monitor.  GERD.  -Patient has significant GERD symptoms, may be contributing to asthma and recent pneumonia. -We will start omeprazole 20 mg once daily.  Discussed lifestyle changes for GERD.  Meds ordered this encounter  Medications  . budesonide-formoterol (SYMBICORT) 160-4.5 MCG/ACT inhaler    Sig: Inhale 2 puffs into the lungs 2 (two) times daily. Rinse mouth after use    Dispense:  1 Inhaler    Refill:  12   Orders Placed This Encounter  Procedures  . Spirometry with Graph   Orders Placed This Encounter  Procedures  . DG Chest 2 View  . Spirometry with Graph    Return in about 3 months (around 09/16/2018).   Date: 06/16/2018  MRN# [id number] Edwin Nguyen April 26, 1976   Edwin Nguyen is a 42 y.o. old male seen in consultation for chief complaint of:    Chief Complaint  Patient presents with  . Consult    Referred by [facility]  . Wheezing    with exertion  . Shortness of Breath    with exertion  . Cough    clear mucus  . Chest Pain    when coughing    HPI:   The patient is a 42 yo male with a history of pneumonia requiring a 1 day admission in August of this year. Then developed a recurrent episode of acute bronchitis.  He has noticed that he has been having some wheezing, he works in [employer]. Sometimes the breathing is worse when he is more active.  He feels that the breathing is improved, but he has occasional bouts of coughing with activity.   He has been using qvar 2 puffs once daily then brushes teeth. He uses albuterol a few times daily.  He has no pets, he does have reflux which is "bad", he eats high fat foods, and eats fast food on most days. He  eats at night between 5 and 8 pm, he then snacks on candy because he gets the munchies at night.  He was smoking a ppd and quit about 10 weeks ago.   **CT chest 04/26/18>> Imaging personally reviewed; Bilateral scattered mild infiltrates. Mild right hilar lymphadenopathy.  **Spirometry 06/16/2018>> tracings personally reviewed.  FVC is 96% predicted, FEV1 is 89% protected, ratio is 73%.  Expiratory time 6.4 seconds.  Overall this test shows normal pulmonary functions without evidence of obstructive lung disease.   PMHX:   Past Medical History:  Diagnosis Date  . Acute bronchitis   . Bronchitis    Surgical Hx:  History reviewed. No pertinent surgical history. Family Hx:  Family History  Problem Relation Age of Onset  . Prostate cancer Father   . Colon cancer Father   . Colon cancer Brother    Social Hx:   Social History   Tobacco Use  . Smoking status: Former Smoker    Packs/day: 1.00    Years: 13.00    Pack years: 13.00    Last attempt to quit: 03/10/2018    Years since quitting: 0.2  . Smokeless tobacco: Never Used  . Tobacco comment: quit 10 weeks ago  Substance Use Topics  . Alcohol use: Not Currently  Frequency: Never  . Drug use: Not Currently   Medication:    Current Outpatient Medications:  .  albuterol (PROVENTIL HFA;VENTOLIN HFA) 108 (90 Base) MCG/ACT inhaler, Inhale 2 puffs into the lungs every 6 (six) hours as needed for wheezing or shortness of breath., Disp: 1 Inhaler, Rfl: 2 .  beclomethasone (QVAR REDIHALER) 40 MCG/ACT inhaler, Inhale 2 puffs into the lungs 2 (two) times daily., Disp: 1 Inhaler, Rfl: 0 .  ipratropium-albuterol (DUONEB) 0.5-2.5 (3) MG/3ML SOLN, Take 3 mLs by nebulization every 6 (six) hours. Dx: J44.1, Disp: 360 mL, Rfl: 0   Allergies:  Patient has no known allergies.  Review of Systems: Gen:  Denies  fever, sweats, chills HEENT: Denies blurred vision, double vision. bleeds, sore throat Cvc:  No dizziness, chest pain. Resp:   Denies  cough or sputum production, shortness of breath Gi: Denies swallowing difficulty, stomach pain. Gu:  Denies bladder incontinence, burning urine Ext:   No Joint pain, stiffness. Skin: No skin rash,  hives  Endoc:  No polyuria, polydipsia. Psych: No depression, insomnia. Other:  All other systems were reviewed with the patient and were negative other that what is mentioned in the HPI.   Physical Examination:   VS: Resp 16   Ht 6' (1.829 m)   Wt 203 lb (92.1 kg)   BMI 27.53 kg/m   General Appearance: No distress  Neuro:without focal findings,  speech normal,  HEENT: PERRLA, EOM intact.   Pulmonary: normal breath sounds, No wheezing.  CardiovascularNormal S1,S2.  No m/r/g.   Abdomen: Benign, Soft, non-tender. Renal:  No costovertebral tenderness  GU:  No performed at this time. Endoc: No evident thyromegaly, no signs of acromegaly. Skin:   warm, no rashes, no ecchymosis  Extremities: normal, no cyanosis, clubbing.  Other findings:    LABORATORY PANEL:   CBC No results for input(s): WBC, HGB, HCT, PLT in the last 168 hours. ------------------------------------------------------------------------------------------------------------------  Chemistries  No results for input(s): NA, K, CL, CO2, GLUCOSE, BUN, CREATININE, CALCIUM, MG, AST, ALT, ALKPHOS, BILITOT in the last 168 hours.  Invalid input(s): GFRCGP ------------------------------------------------------------------------------------------------------------------  Cardiac Enzymes No results for input(s): TROPONINI in the last 168 hours. ------------------------------------------------------------  RADIOLOGY:  No results found.     Thank  you for the consultation and for allowing [facility] to assist in the care of your patient. Our recommendations are noted above.  Please contact us if we can be of further service.   Marda Stalker, M.D., F.C.C.P.  Board Certified in Internal  Medicine, Pulmonary Medicine, [place], and Sleep Medicine.   Pulmonary and Critical Care Office Number: [phone]   06/16/2018

## 2018-10-07 ENCOUNTER — Emergency Department (HOSPITAL_COMMUNITY)
Admission: EM | Admit: 2018-10-07 | Discharge: 2018-10-07 | Disposition: A | Discharge status: Home / Self Care | Payer: Managed Care, Other (non HMO) | Attending: Emergency Medicine | Admitting: Emergency Medicine

## 2018-10-07 ENCOUNTER — Other Ambulatory Visit: Payer: Self-pay

## 2018-10-07 ENCOUNTER — Encounter (HOSPITAL_COMMUNITY): Payer: Self-pay | Admitting: Emergency Medicine

## 2018-10-07 DIAGNOSIS — Y9289 Other specified places as the place of occurrence of the external cause: Secondary | ICD-10-CM | POA: Insufficient documentation

## 2018-10-07 DIAGNOSIS — T1591XA Foreign body on external eye, part unspecified, right eye, initial encounter: Secondary | ICD-10-CM | POA: Insufficient documentation

## 2018-10-07 DIAGNOSIS — X58XXXA Exposure to other specified factors, initial encounter: Secondary | ICD-10-CM | POA: Diagnosis not present

## 2018-10-07 DIAGNOSIS — Y9389 Activity, other specified: Secondary | ICD-10-CM | POA: Insufficient documentation

## 2018-10-07 DIAGNOSIS — Y998 Other external cause status: Secondary | ICD-10-CM | POA: Diagnosis not present

## 2018-10-07 MED ORDER — POLYMYXIN B-TRIMETHOPRIM 10000-0.1 UNIT/ML-% OP SOLN: 1.0000 [drp] | OPHTHALMIC | 0 refills | Status: AC

## 2018-10-07 MED ORDER — TETRACAINE HCL 0.5 % OP SOLN
OPHTHALMIC | Status: AC
  Filled 2018-10-07: qty 4

## 2018-10-07 MED ORDER — FLUORESCEIN SODIUM 1 MG OP STRP
ORAL_STRIP | OPHTHALMIC | Status: AC
  Filled 2018-10-07: qty 1

## 2018-10-07 NOTE — Discharge Instructions (Signed)
Please use the antibiotic eye drops, polytrim 6 times per day for 5 days, unless told differently by the ophthalmologist. Please schedule an appointment with the eye doctor, Dr. Posey Pronto in a few days for re-check of your eye to make sure no further foreign body remains.

## 2018-10-07 NOTE — ED Triage Notes (Signed)
Redness and pain to RT eye since last night. Washed out with saline with no relief.  Denies any injury

## 2018-10-07 NOTE — ED Provider Notes (Signed)
[facility] EMERGENCY DEPARTMENT Provider Note   CSN: [id number] Arrival date & time: 10/07/18  5573   History   Chief Complaint Chief Complaint  Patient presents with  . Eye Problem    HPI Edwin Nguyen is a 43 y.o. male.  Patient reports that he felt something in his eye yesterday afternoon.  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  Patient reports that he felt as if something was in his eyes so he went to [facility] around 1 AM to get a saline eyewash which he reports did not help in getting a feeling out of his eye.  Patient denies any pain with movement of his eye although does report that he is having some pain behind his eye.  Patient denies any change in his vision.  Denies any discharge from his eye but does report some redness.  Patient denies any recent illness, fever, chills, nausea, vomiting, ear pain, loss of hearing, rhinorrhea, congestion.  Reports that he is not on any medications daily.  Denies any history of hypertension or diabetes.    Past Medical History:  Diagnosis Date  . Acute bronchitis   . Bronchitis     Patient Active Problem List   Diagnosis Date Noted  . Pneumonia 04/16/2018    History reviewed. No pertinent surgical history.    Home Medications    Prior to Admission medications   Medication Sig Start Date End Date Taking? Authorizing Provider  albuterol (PROVENTIL HFA;VENTOLIN HFA) 108 (90 Base) MCG/ACT inhaler Inhale 2 puffs into the lungs every 6 (six) hours as needed for wheezing or shortness of breath. 06/05/18   Sable Feil, PA-C  beclomethasone (QVAR REDIHALER) 40 MCG/ACT inhaler Inhale 2 puffs into the lungs 2 (two) times daily. 04/17/18   Loletha Grayer, MD  budesonide-formoterol [facility]) 160-4.5 MCG/ACT inhaler Inhale 2 puffs into the lungs 2 (two) times daily. Rinse mouth after use 06/16/18   Laverle Hobby, MD    ipratropium-albuterol (DUONEB) 0.5-2.5 (3) MG/3ML SOLN Take 3 mLs by nebulization every 6 (six) hours. Dx: J44.1 04/17/18   Loletha Grayer, MD  trimethoprim-polymyxin b (POLYTRIM) ophthalmic solution Place 1 drop into the right eye every 4 (four) hours for 5 days. 10/07/18 10/12/18  Florean Hoobler, [place], DO    Family History Family History  Problem Relation Age of Onset  . Prostate cancer Father   . Colon cancer Father   . Colon cancer Brother     Social History Social History   Tobacco Use  . Smoking status: Former Smoker    Packs/day: 1.00    Years: 13.00    Pack years: 13.00    Last attempt to quit: 03/10/2018    Years since quitting: 0.5  . Smokeless tobacco: Never Used  . Tobacco comment: quit 10 weeks ago  Substance Use Topics  . Alcohol use: Not Currently    Frequency: Never  . Drug use: Not Currently    Allergies   Patient has no known allergies.   Review of Systems Review of Systems  Constitutional: Negative for chills and fever.  HENT: Negative for congestion, drooling, ear discharge, ear pain, facial swelling, rhinorrhea, sinus pain, sore throat and trouble swallowing.   Eyes: Positive for pain and redness. Negative for photophobia, discharge, itching and visual disturbance.  Neurological: Negative for dizziness, light-headedness and headaches.  All other systems reviewed and are negative.  Physical Exam Updated Vital Signs BP 124/69 (BP Location: Left Arm)   Pulse 63   Temp 97.9 F (36.6 C) (Oral)   Resp 18   Ht 6\' 1"  (1.854 m)   Wt 95.3 kg   SpO2 98%   BMI 27.71 kg/m   Physical Exam Vitals signs and nursing note reviewed.  Constitutional:      General: He is not in acute distress.    Appearance: Normal appearance. He is normal weight.  HENT:     Head: Normocephalic and atraumatic.     Right Ear: There is impacted cerumen.     Left Ear: Tympanic membrane normal. There is no impacted cerumen.     Nose: Nose normal. No congestion.     Mouth/Throat:      Mouth: Mucous membranes are moist.     Pharynx: Oropharynx is clear.  Eyes:     General: No scleral icterus.       Right eye: No discharge.        Left eye: No discharge.     Extraocular Movements: Extraocular movements intact.     Conjunctiva/sclera: Conjunctivae normal.     Pupils: Pupils are equal, round, and reactive to light.     Comments: R eye: Sty on upper eyelid; small foreign body noted at 3 o'clock on cornea  Neck:     Musculoskeletal: Normal range of motion and neck supple.  Pulmonary:     Effort: Pulmonary effort is normal.  Lymphadenopathy:     Cervical: No cervical adenopathy.  Skin:    General: Skin is warm.  Neurological:     Mental Status: He is alert.  Psychiatric:        Mood and Affect: Mood normal.        Behavior: Behavior normal.        Thought Content: Thought content normal.        Judgment: Judgment normal.    ED Treatments / Results  Labs (all labs ordered are listed, but only abnormal results are displayed) Labs Reviewed - No data to display  EKG None  Radiology No results found.  Procedures .Foreign Body Removal Date/Time: 10/07/2018 8:38 AM Performed by: Yaritzi Craun, [place], DO Authorized by: Elnora Morrison, MD  Consent: Verbal consent obtained. Written consent not obtained. Consent given by: patient Patient understanding: patient states understanding of the procedure being performed Patient consent: the patient's understanding of the procedure matches consent given Procedure consent: procedure consent matches procedure scheduled Site marked: the operative site was marked Patient identity confirmed: verbally with patient Time out: Immediately prior to procedure a "time out" was called to verify the correct patient, procedure, equipment, support staff and site/side marked as required. Body area: eye Location details: right cornea Anesthesia: local infiltration  Anesthesia: Local Anesthetic: topical anesthetic and tetracaine  drops Anesthetic total: 6 mL  Sedation: Patient sedated: no  Patient restrained: no Patient cooperative: yes Localization method: slit lamp, magnification and visualized Removal mechanism: moist cotton swab (30 gauge needle) Eye examined with fluorescein. Corneal abrasion size: small Corneal abrasion location: medial No residual rust ring present. Dressing: antibiotic drops Depth: superficial 1 objects recovered. Post-procedure assessment: residual foreign bodies remain Patient tolerance: Patient tolerated the procedure well with no immediate complications Comments: Likely small residual foreign bodies remain, hard to visualize. Referring for follow up with ophthalmology.    (including critical care time)  Medications Ordered in ED Medications  tetracaine (PONTOCAINE) 0.5 % ophthalmic solution (has no administration in time range)  fluorescein 1  MG ophthalmic strip (has no administration in time range)     Initial Impression / Assessment and Plan / ED Course  I have reviewed the triage vital signs and the nursing notes.  Pertinent labs & imaging results that were available during my care of the patient were reviewed by me and considered in my medical decision making (see chart for details).   43 year old male presenting with eye pain for 1 day.  Patient states that he feels like something is in his eye that he does have some pain associated.  However denies any discharge and states he is only having some mild redness.  Does not recall anything that could have gone into his eye.  Currently is unemployed.  No concern for increased IOP based on exam findings and history.   Fluorescein and Wood lamp exam revealed foreign body on medial aspect of cornea at 3 o'clock position.  Use tetracaine solution in order to numb and attempted to remove with cotton swab, insulin needle and then moist cotton swab.  On reexamination with Woods lamp unable to see foreign body however small residual  could remain.  Patient to follow-up with ophthalmology for further examination in 2 to 3 days.  Given Polytrim antibiotic solution to use 6 times per day for 5 days unless instructed otherwise by ophthalmologist.  Given strict return precautions.  [place] Yitzhak Awan, DO PGY-2, [facility]   Final Clinical Impressions(s) / ED Diagnoses   Final diagnoses:  Foreign body, eye, right, initial encounter    ED Discharge Orders         Ordered    trimethoprim-polymyxin b (POLYTRIM) ophthalmic solution  Every 4 hours     10/07/18 0848           Delma Drone, [place], DO 10/07/18 1007    Elnora Morrison, MD 10/07/18 1635

## 2019-01-13 MED ORDER — ALBUTEROL SULFATE (2.5 MG/3ML) 0.083% IN NEBU: 2.5000 mg | INHALATION_SOLUTION | Freq: Four times a day (QID) | RESPIRATORY_TRACT | 12 refills | Status: AC | PRN

## 2019-01-13 MED ORDER — ALBUTEROL SULFATE HFA 108 (90 BASE) MCG/ACT IN AERS: 2.0000 | INHALATION_SPRAY | Freq: Four times a day (QID) | RESPIRATORY_TRACT | 2 refills | Status: DC | PRN

## 2019-01-13 NOTE — Telephone Encounter (Signed)
Pt is requesting Rx for albuterol HFA and nebulizer solution.  It does not appear that these medications have been prescribed by our office previously.  Dr. Juanell Fairly please advise if okay to refill.

## 2019-01-13 NOTE — Telephone Encounter (Signed)
Pt called in to give [facility] on [address], [place], [place]  Pt stated that he does not want to schedule appt. At this time but will call us back when he looks at his schedule and find a good time.

## 2019-01-13 NOTE — Addendum Note (Signed)
Addended by: Darreld Mclean on: 01/13/2019 09:15 AM   Modules accepted: Orders

## 2019-01-13 NOTE — Addendum Note (Signed)
Addended by: Maryanna Shape A on: 01/13/2019 10:31 AM   Modules accepted: Orders

## 2019-03-06 IMAGING — CT CT ANGIO CHEST
2 of 6 series · 19 of 46 positions shown · IV contrast (APPLIED)
Comparison: Chest x-ray dated 04/16/2018

CLINICAL DATA: Shortness of breath and cough.

EXAM:
CT ANGIOGRAPHY CHEST WITH CONTRAST
TECHNIQUE: Multidetector CT imaging of the chest was performed using the
standard protocol during bolus administration of intravenous
contrast. Multiplanar CT image reconstructions and MIPs were
obtained to evaluate the vascular anatomy.
CONTRAST:  75mL 3BDWGX-G4G IOPAMIDOL (3BDWGX-G4G) INJECTION 76%

[Series 5: thins · axial · 0.70mm/px · z∈[-805,-499]mm · 16 of 336 slices shown]
[im 15/336  lung]
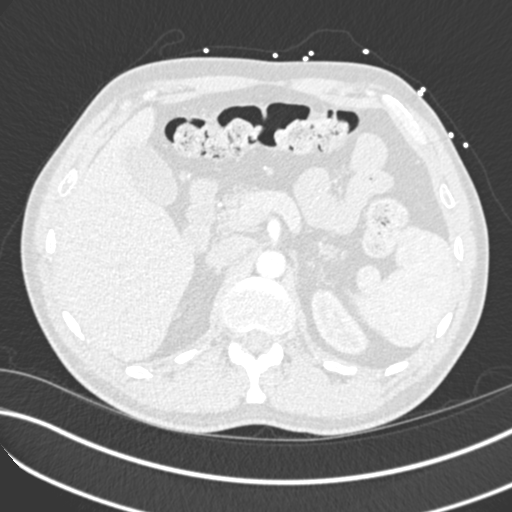
[im 44/336  soft-tissue]
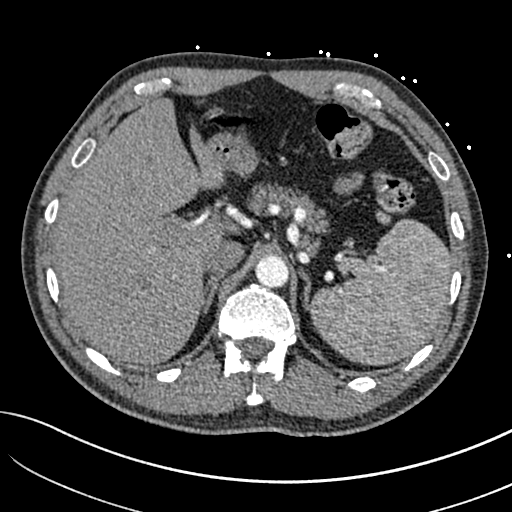
[im 59/336  lung]
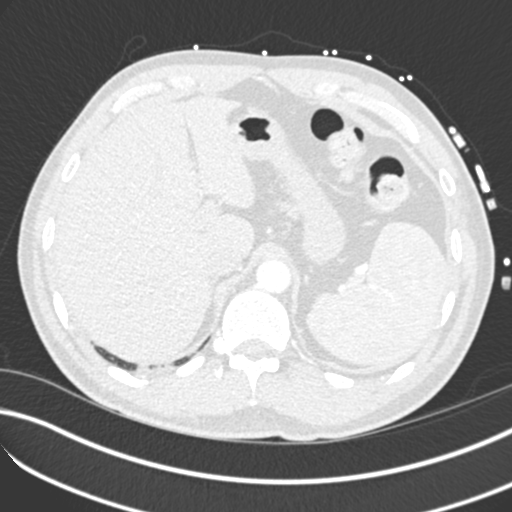
[im 73/336  soft-tissue]
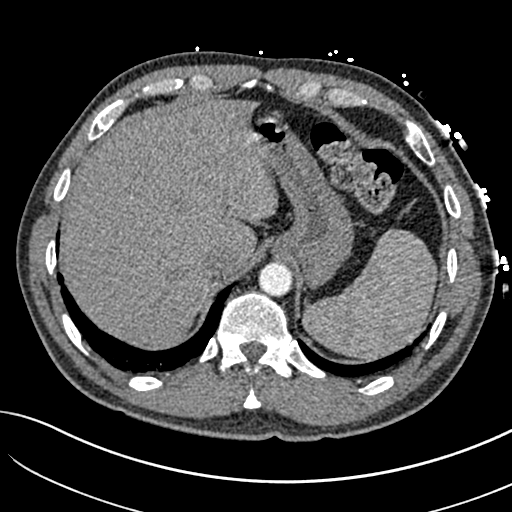
[im 102/336  lung]
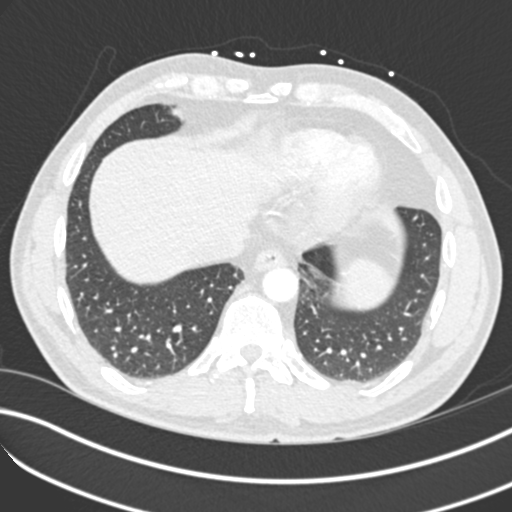
[im 117/336  soft-tissue]
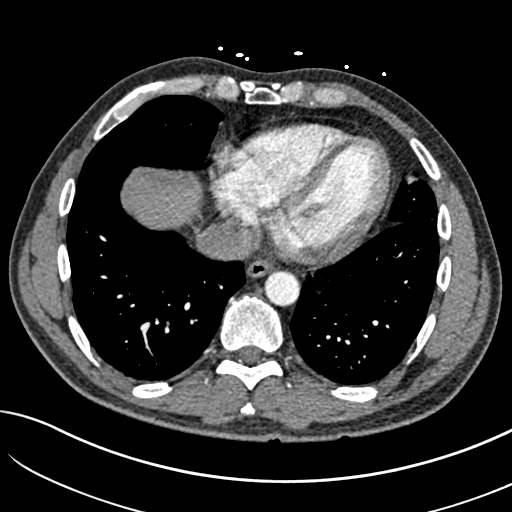
[im 132/336  lung]
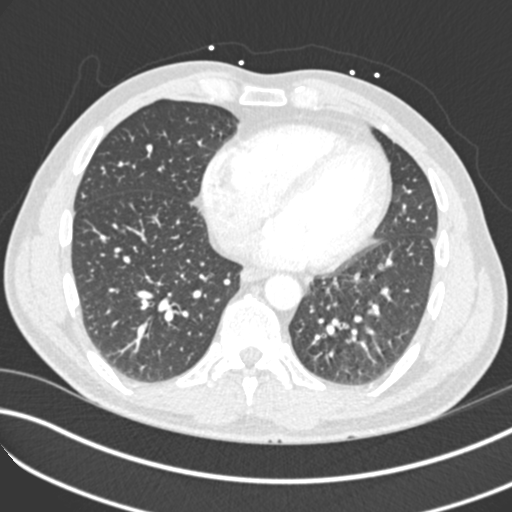
[im 161/336  soft-tissue]
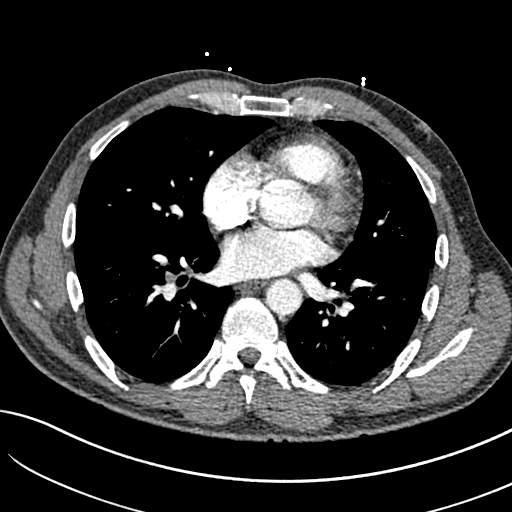
[im 175/336  lung]
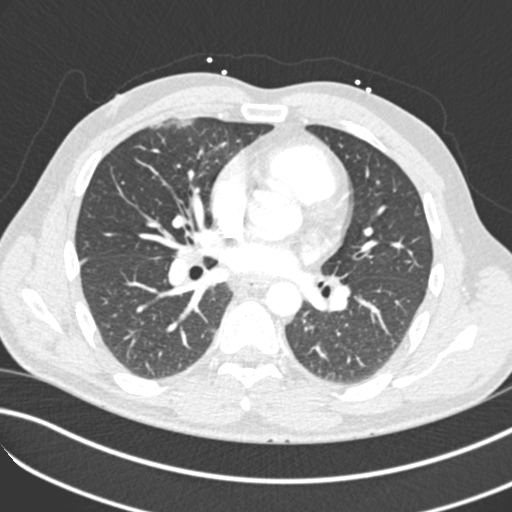
[im 204/336  soft-tissue]
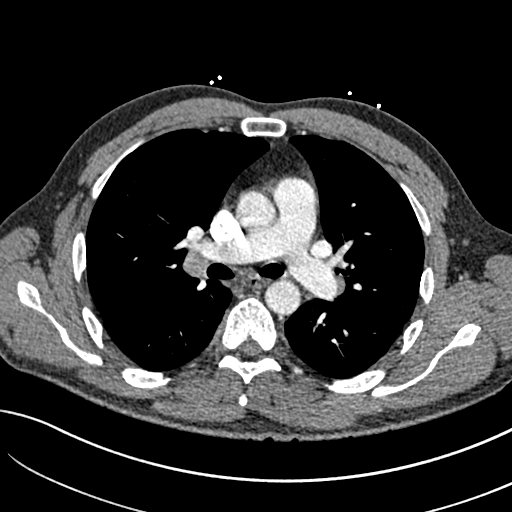
[im 219/336  lung]
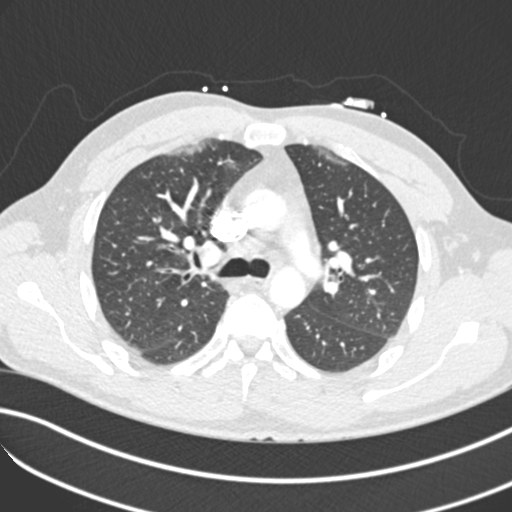
[im 234/336  soft-tissue]
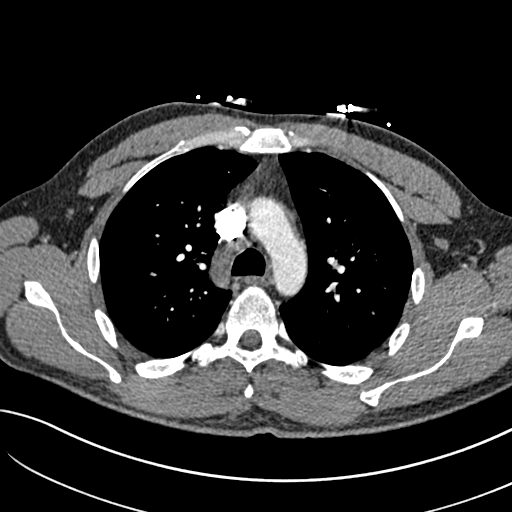
[im 263/336  lung]
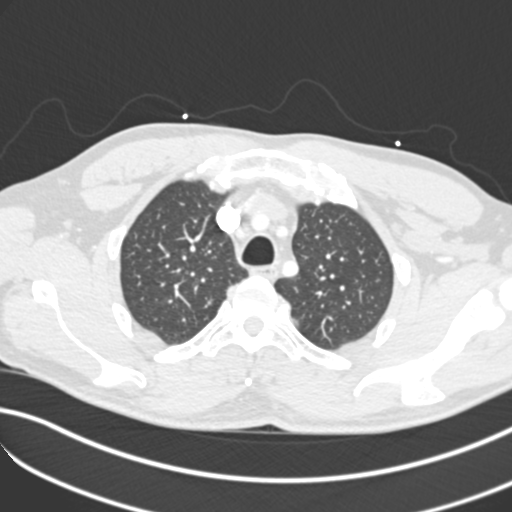
[im 277/336  soft-tissue]
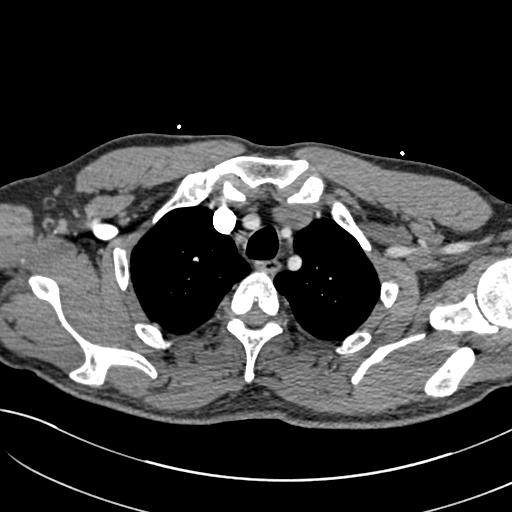
[im 292/336  lung]
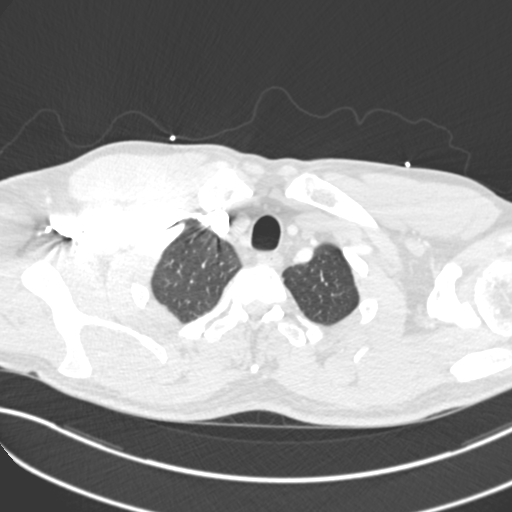
[im 321/336  soft-tissue]
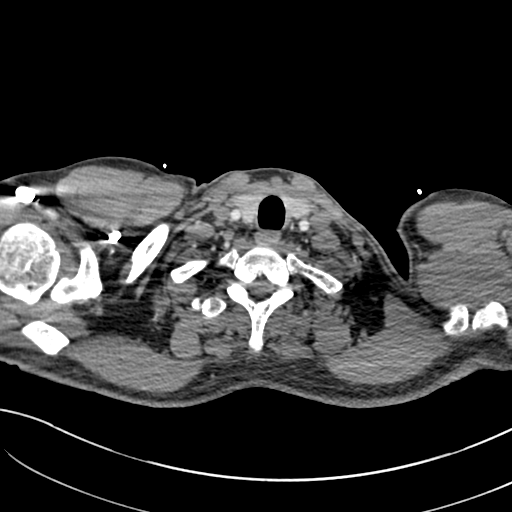

[Series 7: coronal mpr · coronal · 0.66mm/px · 3 of 83 slices shown]
[im 21/83  soft-tissue]
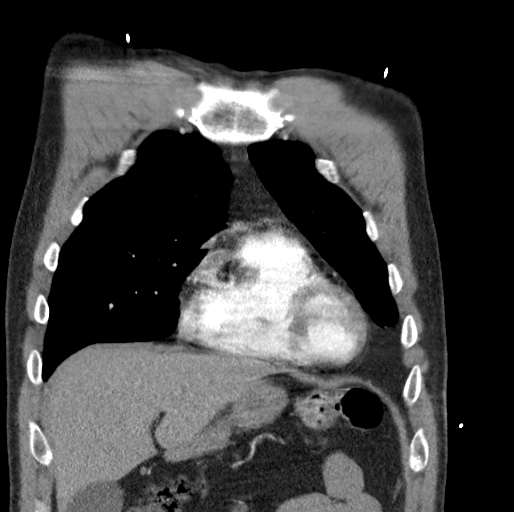
[im 42/83  soft-tissue]
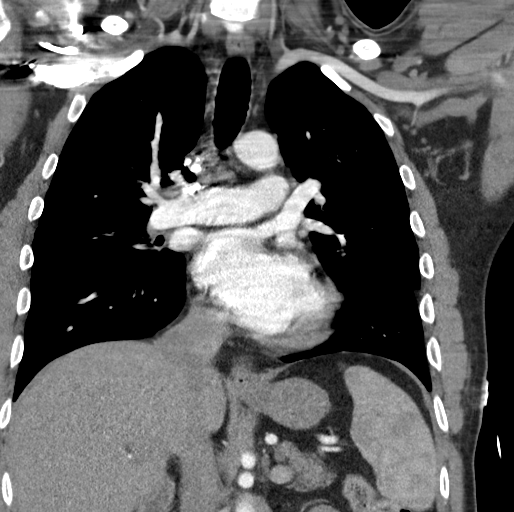
[im 62/83  soft-tissue]
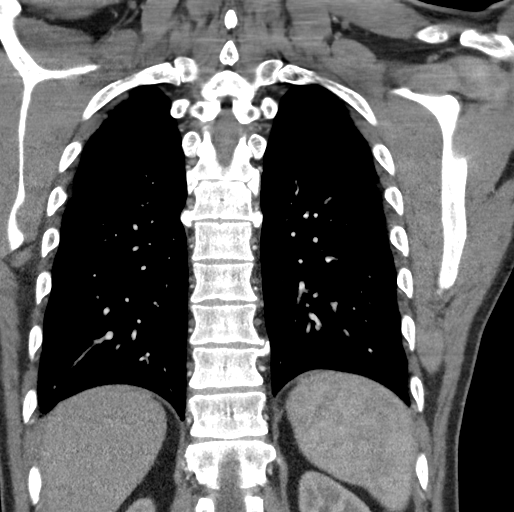

[19 of 46 positions shown; findings below may reference images not displayed]

FINDINGS: Cardiovascular: Satisfactory opacification of the pulmonary arteries
to the segmental level. No evidence of pulmonary embolism. Normal
heart size. No pericardial effusion.

Mediastinum/Nodes: Slightly prominent lymph nodes in both hilar
regions, 15 mm on image 45 on series 4 in the right hilum and 11 mm
in the left hilum on image 51 series 4. Thyroid gland, trachea, and
esophagus demonstrate no significant findings.

Lungs/Pleura: Multiple small patchy areas of infiltrate bilaterally.
Bilateral peribronchial thickening. No effusions.

Upper Abdomen: Normal.

Musculoskeletal: No chest wall abnormality. No acute or significant
osseous findings.

Review of the MIP images confirms the above findings.
IMPRESSION: 1. No pulmonary emboli.
2. Multiple small patchy areas of infiltrate bilaterally.
3. Bronchitic changes.

## 2019-06-04 ENCOUNTER — Other Ambulatory Visit: Payer: Self-pay | Admitting: Internal Medicine

## 2019-06-18 ENCOUNTER — Other Ambulatory Visit: Payer: Self-pay | Admitting: Internal Medicine

## 2019-06-25 ENCOUNTER — Telehealth: Payer: Managed Care, Other (non HMO) | Admitting: Physician Assistant

## 2019-06-25 DIAGNOSIS — J45909 Unspecified asthma, uncomplicated: Secondary | ICD-10-CM

## 2019-06-25 MED ORDER — ALBUTEROL SULFATE HFA 108 (90 BASE) MCG/ACT IN AERS: 2.0000 | INHALATION_SPRAY | Freq: Four times a day (QID) | RESPIRATORY_TRACT | 0 refills | Status: AC | PRN

## 2019-06-25 NOTE — Progress Notes (Signed)
I have spent 5 minutes in review of e-visit questionnaire, review and updating patient chart, medical decision making and response to patient.   Wilgus Deyton Cody Martino Tompson, PA-C    

## 2019-06-25 NOTE — Progress Notes (Signed)
E Visit for Asthma  Based on what you have shared with me, it looks like you may have a flare up of your asthma.  Asthma is a chronic (ongoing) lung disease which results in airway obstruction, inflammation and hyper-responsiveness.   Asthma symptoms vary from person to person, with common symptoms including nighttime awakening and decreased ability to participate in normal activities as a result of shortness of breath. It is often triggered by changes in weather, changes in the season, changes in air temperature, or inside (home, school, daycare or work) allergens such as animal dander, mold, mildew, woodstoves or cockroaches.   It can also be triggered by hormonal changes, extreme emotion, physical exertion or an upper respiratory tract illness.     It is important to identify the trigger, and then eliminate or avoid the trigger if possible.   If you have been prescribed medications to be taken on a regular basis, it is important to follow the asthma action plan and to follow guidelines to adjust medication in response to increasing symptoms of decreased peak expiratory flow rate  Treatment: I have prescribed: Albuterol (Proventil HFA; Ventolin HFA) 108 (90 Base) MCG/ACT Inhaler 2 puffs into the lungs every six hours as needed for wheezing or shortness of breath  HOME CARE . Only take medications as instructed by your medical team. . Consider wearing a mask or scarf to improve breathing air temperature have been shown to decrease irritation and decrease exacerbations . Get rest. . Taking a steamy shower or using a humidifier may help nasal congestion sand ease sore throat pain. You can place a towel over your head and breathe in the steam from hot water coming from a faucet. . Using a saline nasal spray works much the same way.  . Cough drops, hare candies and sore throat lozenges may  ease your cough.  . Avoid close contacts especially the very you and the elderly . Cover your mouth if you cough or sneeze . Always remember to wash your hands.    GET HELP RIGHT AWAY IF: . You develop worsening symptoms; breathlessness at rest, drowsy, confused or agitated, unable to speak in full sentences . You have coughing fits . You develop a severe headache or visual changes . You develop shortness of breath, difficulty breathing or start having chest pain . Your symptoms persist after you have completed your treatment plan . If your symptoms do not improve within 10 days  MAKE SURE YOU . Understand these instructions. . Will watch your condition. . Will get help right away if you are not doing well or get worse.   Your e-visit answers were reviewed by a board certified advanced clinical practitioner to complete your personal care plan, Depending upon the condition, your plan could have included both over the counter or prescription medications.  Please review your pharmacy choice. Your safety is important to us. If you have drug allergies check your prescription carefully. You can use MyChart to ask questions about today's visit, request a non-urgent call back, or ask for a work or school excuse for 24 hours related to this e-Visit. If it has been greater than 24 hours you will need to follow up with your provider, or enter a new e-Visit to address those concerns.  You will get an e-mail in the next two days asking about your experience. I hope that your e-visit has been valuable and will speed your recovery. Thank you for using e-visits. 

## 2019-06-30 ENCOUNTER — Ambulatory Visit: Payer: Managed Care, Other (non HMO) | Admitting: Pulmonary Disease

## 2019-07-09 ENCOUNTER — Other Ambulatory Visit: Payer: Self-pay

## 2019-07-09 ENCOUNTER — Telehealth: Payer: Managed Care, Other (non HMO)

## 2019-10-12 DIAGNOSIS — U071 COVID-19: Secondary | ICD-10-CM

## 2019-10-12 HISTORY — DX: COVID-19: U07.1

## 2019-10-19 ENCOUNTER — Other Ambulatory Visit: Payer: Managed Care, Other (non HMO)

## 2019-10-19 DIAGNOSIS — U071 COVID-19: Secondary | ICD-10-CM | POA: Diagnosis not present

## 2019-10-19 DIAGNOSIS — Z20822 Contact with and (suspected) exposure to covid-19: Secondary | ICD-10-CM | POA: Diagnosis not present

## 2019-12-19 DIAGNOSIS — J42 Unspecified chronic bronchitis: Secondary | ICD-10-CM | POA: Diagnosis not present

## 2019-12-19 DIAGNOSIS — D179 Benign lipomatous neoplasm, unspecified: Secondary | ICD-10-CM | POA: Diagnosis not present

## 2019-12-19 DIAGNOSIS — Z0189 Encounter for other specified special examinations: Secondary | ICD-10-CM | POA: Diagnosis not present

## 2019-12-19 DIAGNOSIS — Z800 Family history of malignant neoplasm of digestive organs: Secondary | ICD-10-CM | POA: Diagnosis not present

## 2019-12-21 DIAGNOSIS — E785 Hyperlipidemia, unspecified: Secondary | ICD-10-CM | POA: Diagnosis not present

## 2019-12-21 DIAGNOSIS — Z0189 Encounter for other specified special examinations: Secondary | ICD-10-CM | POA: Diagnosis not present

## 2019-12-26 DIAGNOSIS — J302 Other seasonal allergic rhinitis: Secondary | ICD-10-CM | POA: Diagnosis not present

## 2019-12-26 DIAGNOSIS — J4540 Moderate persistent asthma, uncomplicated: Secondary | ICD-10-CM | POA: Diagnosis not present

## 2019-12-26 DIAGNOSIS — Z0000 Encounter for general adult medical examination without abnormal findings: Secondary | ICD-10-CM | POA: Diagnosis not present

## 2019-12-26 DIAGNOSIS — D179 Benign lipomatous neoplasm, unspecified: Secondary | ICD-10-CM | POA: Diagnosis not present

## 2019-12-29 ENCOUNTER — Other Ambulatory Visit (HOSPITAL_COMMUNITY): Payer: Self-pay | Admitting: Respiratory Therapy

## 2019-12-29 DIAGNOSIS — R0602 Shortness of breath: Secondary | ICD-10-CM

## 2019-12-29 DIAGNOSIS — J4540 Moderate persistent asthma, uncomplicated: Secondary | ICD-10-CM

## 2020-01-12 ENCOUNTER — Ambulatory Visit (HOSPITAL_COMMUNITY)
Admission: RE | Admit: 2020-01-12 | Discharge: 2020-01-12 | Disposition: A | Discharge status: Home / Self Care | Payer: BC Managed Care – PPO | Source: Ambulatory Visit | Attending: Internal Medicine | Admitting: Internal Medicine

## 2020-01-12 ENCOUNTER — Other Ambulatory Visit (HOSPITAL_COMMUNITY): Payer: Self-pay | Admitting: Internal Medicine

## 2020-01-12 ENCOUNTER — Other Ambulatory Visit: Payer: Self-pay

## 2020-01-12 DIAGNOSIS — R05 Cough: Secondary | ICD-10-CM

## 2020-01-12 DIAGNOSIS — R059 Cough, unspecified: Secondary | ICD-10-CM

## 2020-01-12 DIAGNOSIS — R0602 Shortness of breath: Secondary | ICD-10-CM

## 2020-01-15 ENCOUNTER — Other Ambulatory Visit: Payer: Self-pay

## 2020-01-15 ENCOUNTER — Other Ambulatory Visit (HOSPITAL_COMMUNITY)
Admission: RE | Admit: 2020-01-15 | Discharge: 2020-01-15 | Disposition: A | Discharge status: Home / Self Care | Payer: BC Managed Care – PPO | Source: Ambulatory Visit | Attending: Internal Medicine | Admitting: Internal Medicine

## 2020-01-18 ENCOUNTER — Encounter: Payer: Self-pay | Admitting: Gastroenterology

## 2020-01-19 ENCOUNTER — Ambulatory Visit (HOSPITAL_COMMUNITY): Admission: RE | Admit: 2020-01-19 | Payer: BC Managed Care – PPO | Source: Ambulatory Visit

## 2020-01-19 ENCOUNTER — Telehealth: Payer: Self-pay | Admitting: Internal Medicine

## 2020-01-19 NOTE — Telephone Encounter (Signed)
Called and spoke with AMY per DPR , let her know symbicort went generic and we no longer receive coupons.   Amy voiced understanding, offered to get patient in to see a new provider denied at this time.  Nothing further needed

## 2020-01-20 ENCOUNTER — Telehealth: Payer: Self-pay

## 2020-01-20 NOTE — Telephone Encounter (Signed)
Pt was NS for PV.  Called pt - LM to call back by 5:00 pm today to avoid cancellation of procedure.

## 2020-01-20 NOTE — Telephone Encounter (Signed)
Left message to call back  

## 2020-01-20 NOTE — Telephone Encounter (Signed)
Pt r/t call to reschedule PV to 5/26-Room 51

## 2020-01-23 DIAGNOSIS — J209 Acute bronchitis, unspecified: Secondary | ICD-10-CM | POA: Diagnosis not present

## 2020-01-23 DIAGNOSIS — R05 Cough: Secondary | ICD-10-CM | POA: Diagnosis not present

## 2020-01-23 DIAGNOSIS — R062 Wheezing: Secondary | ICD-10-CM | POA: Diagnosis not present

## 2020-02-03 ENCOUNTER — Ambulatory Visit (AMBULATORY_SURGERY_CENTER): Payer: Self-pay

## 2020-02-03 ENCOUNTER — Other Ambulatory Visit: Payer: Self-pay

## 2020-02-03 VITALS — Ht 73.0 in | Wt 192.8 lb

## 2020-02-03 DIAGNOSIS — Z1211 Encounter for screening for malignant neoplasm of colon: Secondary | ICD-10-CM

## 2020-02-03 DIAGNOSIS — Z800 Family history of malignant neoplasm of digestive organs: Secondary | ICD-10-CM

## 2020-02-03 MED ORDER — NA SULFATE-K SULFATE-MG SULF 17.5-3.13-1.6 GM/177ML PO SOLN: 1.0000 | Freq: Once | ORAL | 0 refills | Status: AC

## 2020-02-03 NOTE — Progress Notes (Signed)
No allergies to soy or egg Pt is not on blood thinners or diet pills Denies issues with sedation/intubation Denies atrial flutter/fib Denies constipation   Emmi instructions given to pt  Pt is aware of Covid safety and care partner requirements.  

## 2020-02-05 ENCOUNTER — Ambulatory Visit (INDEPENDENT_AMBULATORY_CARE_PROVIDER_SITE_OTHER): Payer: BC Managed Care – PPO

## 2020-02-05 ENCOUNTER — Other Ambulatory Visit: Payer: Self-pay | Admitting: Gastroenterology

## 2020-02-05 DIAGNOSIS — Z1159 Encounter for screening for other viral diseases: Secondary | ICD-10-CM

## 2020-02-09 ENCOUNTER — Other Ambulatory Visit: Payer: Self-pay

## 2020-02-09 ENCOUNTER — Ambulatory Visit (AMBULATORY_SURGERY_CENTER): Payer: BC Managed Care – PPO | Admitting: Gastroenterology

## 2020-02-09 ENCOUNTER — Encounter (HOSPITAL_COMMUNITY): Payer: BC Managed Care – PPO

## 2020-02-09 ENCOUNTER — Encounter: Payer: Self-pay | Admitting: Gastroenterology

## 2020-02-09 VITALS — BP 110/72 | HR 59 | Temp 98.2°F | Resp 11 | Ht 73.0 in | Wt 192.8 lb

## 2020-02-09 DIAGNOSIS — Z800 Family history of malignant neoplasm of digestive organs: Secondary | ICD-10-CM

## 2020-02-09 DIAGNOSIS — Z1211 Encounter for screening for malignant neoplasm of colon: Secondary | ICD-10-CM | POA: Diagnosis not present

## 2020-02-09 DIAGNOSIS — D128 Benign neoplasm of rectum: Secondary | ICD-10-CM

## 2020-02-09 DIAGNOSIS — D123 Benign neoplasm of transverse colon: Secondary | ICD-10-CM

## 2020-02-09 DIAGNOSIS — D120 Benign neoplasm of cecum: Secondary | ICD-10-CM

## 2020-02-09 HISTORY — PX: COLONOSCOPY: SHX174

## 2020-02-09 LAB — SARS CORONAVIRUS 2 (TAT 6-24 HRS): SARS Coronavirus 2: NEGATIVE

## 2020-02-09 MED ORDER — SODIUM CHLORIDE 0.9 % IV SOLN: 500.0000 mL | INTRAVENOUS | Status: DC

## 2020-02-09 NOTE — Progress Notes (Signed)
Pt's states no medical or surgical changes since previsit or office visit. 

## 2020-02-09 NOTE — Progress Notes (Signed)
pt tolerated well. VSS. awake and to recovery. Report given to RN.  

## 2020-02-09 NOTE — Patient Instructions (Signed)
Read all of the handouts given to you by your recovery room nurse.  You will need a colonoscopy in 3 years.  Thank-you for choosing [place] for your healthcare needs today.  YOU HAD AN ENDOSCOPIC PROCEDURE TODAY AT [facility]:   Refer to the procedure report that was given to you for any specific questions about what was found during the examination.  If the procedure report does not answer your questions, please call your gastroenterologist to clarify.  If you requested that your care partner not be given the details of your procedure findings, then the procedure report has been included in a sealed envelope for you to review at your convenience later.  YOU SHOULD EXPECT: Some feelings of bloating in the abdomen. Passage of more gas than usual.  Walking can help get rid of the air that was put into your GI tract during the procedure and reduce the bloating. If you had a lower endoscopy (such as a colonoscopy or flexible sigmoidoscopy) you may notice spotting of blood in your stool or on the toilet paper. If you underwent a bowel prep for your procedure, you may not have a normal bowel movement for a few days.  Please Note:  You might notice some irritation and congestion in your nose or some drainage.  This is from the oxygen used during your procedure.  There is no need for concern and it should clear up in a day or so.  SYMPTOMS TO REPORT IMMEDIATELY:   Following lower endoscopy (colonoscopy or flexible sigmoidoscopy):  Excessive amounts of blood in the stool  Significant tenderness or worsening of abdominal pains  Swelling of the abdomen that is new, acute  Fever of 100F or higher   For urgent or emergent issues, a gastroenterologist can be reached at any hour by calling [phone]. Do not use MyChart messaging for urgent concerns.    DIET:  We do recommend a small meal at first, but then you may proceed to your regular diet.  Drink plenty of fluids but you should avoid  alcoholic beverages for 24 hours. We suggest a high fiber diet, and drink plenty of water.  ACTIVITY:  You should plan to take it easy for the rest of today and you should NOT DRIVE or use heavy machinery until tomorrow (because of the sedation medicines used during the test).    FOLLOW UP: Our staff will call the number listed on your records 48-72 hours following your procedure to check on you and address any questions or concerns that you may have regarding the information given to you following your procedure. If we do not reach you, we will leave a message.  We will attempt to reach you two times.  During this call, we will ask if you have developed any symptoms of COVID 19. If you develop any symptoms (ie: fever, flu-like symptoms, shortness of breath, cough etc.) before then, please call [phone].  If you test positive for Covid 19 in the 2 weeks post procedure, please call and report this information to [place].    If any biopsies were taken you will be contacted by phone or by letter within the next 1-3 weeks.  Please call us at [phone] if you have not heard about the biopsies in 3 weeks.    SIGNATURES/CONFIDENTIALITY: You and/or your care partner have signed paperwork which will be entered into your electronic medical record.  These signatures attest to the fact that that the information above on  your After Visit Summary has been reviewed and is understood.  Full responsibility of the confidentiality of this discharge information lies with you and/or your care-partner. 

## 2020-02-09 NOTE — Progress Notes (Signed)
Called to room to assist during endoscopic procedure.  Patient ID and intended procedure confirmed with present staff. Received instructions for my participation in the procedure from the performing physician.  

## 2020-02-09 NOTE — Op Note (Signed)
Edwin Nguyen: Edwin Nguyen Procedure Date: 02/09/2020 7:22 AM MRN: [id number] Endoscopist: Remo Lipps P. Havery Moros , MD Age: 44 Referring MD:  Date of Birth: 04/26/1976 Gender: Male Account #: [id number] Procedure:                Colonoscopy Indications:              Screening in patient at increased risk: Family                            history of 1st-degree relative with colorectal                            cancer (bother diagnosed age 15s, father diagnosed                            age 61s) This is the patient's first colonoscopy Medicines:                Monitored Anesthesia Care Procedure:                Pre-Anesthesia Assessment:                           - Prior to the procedure, a History and Physical                            was performed, and patient medications and                            allergies were reviewed. The patient's tolerance of                            previous anesthesia was also reviewed. The risks                            and benefits of the procedure and the sedation                            options and risks were discussed with the patient.                            All questions were answered, and informed consent                            was obtained. Prior Anticoagulants: The patient has                            taken no previous anticoagulant or antiplatelet                            agents. ASA Grade Assessment: II - A patient with                            mild systemic disease. After reviewing the risks  and benefits, the patient was deemed in                            satisfactory condition to undergo the procedure.                           After obtaining informed consent, the colonoscope                            was passed under direct vision. Throughout the                            procedure, the patient's blood pressure, pulse, and                            oxygen  saturations were monitored continuously. The                            Colonoscope was introduced through the anus and                            advanced to the the terminal ileum, with                            identification of the appendiceal orifice and IC                            valve. The colonoscopy was performed without                            difficulty. The patient tolerated the procedure                            well. The quality of the bowel preparation was                            good. The ileocecal valve, appendiceal orifice, and                            rectum were photographed. Scope In: 8:03:48 AM Scope Out: 8:22:53 AM Scope Withdrawal Time: 0 hours 15 minutes 1 second  Total Procedure Duration: 0 hours 19 minutes 5 seconds  Findings:                 The perianal and digital rectal examinations were                            normal.                           The terminal ileum appeared normal.                           Three flat and sessile polyps were found in the  cecum. The polyps were 3 to 5 mm in size. These                            polyps were removed with a cold snare. Resection                            and retrieval were complete.                           A 4 mm polyp was found in the transverse colon. The                            polyp was sessile. The polyp was removed with a                            cold snare. Resection and retrieval were complete.                           A 4 mm polyp was found in the splenic flexure. The                            polyp was sessile. The polyp was removed with a                            cold snare. Resection and retrieval were complete.                           A 4 mm polyp was found in the rectum. The polyp was                            sessile. The polyp was removed with a cold snare.                            Resection and retrieval were complete.                            A few small-mouthed diverticula were found in the                            sigmoid colon, ascending colon and cecum.                           The exam was otherwise without abnormality. Complications:            No immediate complications. Estimated blood loss:                            Minimal. Estimated Blood Loss:     Estimated blood loss was minimal. Impression:               - The examined portion of the ileum was normal.                           -  Three 3 to 5 mm polyps in the cecum, removed with                            a cold snare. Resected and retrieved.                           - One 4 mm polyp in the transverse colon, removed                            with a cold snare. Resected and retrieved.                           - One 4 mm polyp at the splenic flexure, removed                            with a cold snare. Resected and retrieved.                           - One 4 mm polyp in the rectum, removed with a cold                            snare. Resected and retrieved.                           - Diverticulosis in the sigmoid colon, in the                            ascending colon and in the cecum.                           - The examination was otherwise normal. Recommendation:           - Patient has a contact number available for                            emergencies. The signs and symptoms of potential                            delayed complications were discussed with the                            patient. Return to normal activities tomorrow.                            Written discharge instructions were provided to the                            patient.                           - Resume previous diet.                           - Continue present medications.                           -  Await pathology results.                           - Consideration for genetic testing for Lynch                            syndrome given family history, will  discuss with                            the patient Edwin Nguyen. Edwin Victor, MD 02/09/2020 8:29:40 AM This report has been signed electronically.

## 2020-02-11 ENCOUNTER — Telehealth: Payer: Self-pay

## 2020-02-11 NOTE — Telephone Encounter (Signed)
  Follow up Call-  Call back number 02/09/2020  Post procedure Call Back phone  # [phone]  Permission to leave phone message Yes  Some recent data might be hidden     Patient questions:  Do you have a fever, pain , or abdominal swelling? No. Pain Score  0 *  Have you tolerated food without any problems? Yes.    Have you been able to return to your normal activities? Yes.    Do you have any questions about your discharge instructions: Diet   No. Medications  No. Follow up visit  No.  Do you have questions or concerns about your Care? No.  Actions: * If pain score is 4 or above: No action needed, pain <4.  Have you developed a fever since your procedure? no 2.   Have you had an respiratory symptoms (SOB or cough) since your procedure? no  3.   Have you tested positive for COVID 19 since your procedure no  4.   Have you had any family members/close contacts diagnosed with the COVID 19 since your procedure? no  If yes to any of these questions please route to Joylene John, RN and Erenest Rasher, RN

## 2020-02-16 ENCOUNTER — Ambulatory Visit: Payer: BC Managed Care – PPO | Admitting: General Surgery

## 2020-03-15 ENCOUNTER — Ambulatory Visit (INDEPENDENT_AMBULATORY_CARE_PROVIDER_SITE_OTHER): Payer: BC Managed Care – PPO | Admitting: General Surgery

## 2020-03-15 ENCOUNTER — Other Ambulatory Visit: Payer: Self-pay

## 2020-03-15 ENCOUNTER — Encounter: Payer: Self-pay | Admitting: General Surgery

## 2020-03-15 VITALS — BP 128/78 | HR 78 | Temp 97.3°F | Resp 14 | Ht 73.0 in | Wt 196.0 lb

## 2020-03-15 DIAGNOSIS — D171 Benign lipomatous neoplasm of skin and subcutaneous tissue of trunk: Secondary | ICD-10-CM | POA: Diagnosis not present

## 2020-03-15 NOTE — Patient Instructions (Signed)
Lipoma  A lipoma is a noncancerous (benign) tumor that is made up of fat cells. This is a very common type of soft-tissue growth. Lipomas are usually found under the skin (subcutaneous). They may occur in any tissue of the body that contains fat. Common areas for lipomas to appear include the back, arms, shoulders, buttocks, and thighs. Lipomas grow slowly, and they are usually painless. Most lipomas do not cause problems and do not require treatment. What are the causes? The cause of this condition is not known. What increases the risk? You are more likely to develop this condition if:  You are 40-60 years old.  You have a family history of lipomas. What are the signs or symptoms? A lipoma usually appears as a small, round bump under the skin. In most cases, the lump will:  Feel soft or rubbery.  Not cause pain or other symptoms. However, if a lipoma is located in an area where it pushes on nerves, it can become painful or cause other symptoms. How is this diagnosed? A lipoma can usually be diagnosed with a physical exam. You may also have tests to confirm the diagnosis and to rule out other conditions. Tests may include:  Imaging tests, such as a CT scan or an MRI.  Removal of a tissue sample to be looked at under a microscope (biopsy). How is this treated? Treatment for this condition depends on the size of the lipoma and whether it is causing any symptoms.  For small lipomas that are not causing problems, no treatment is needed.  If a lipoma is bigger or it causes problems, surgery may be done to remove the lipoma. Lipomas can also be removed to improve appearance. Most often, the procedure is done after applying a medicine that numbs the area (local anesthetic).  Liposuction may be done to reduce the size of the lipoma before it is removed through surgery, or it may be done to remove the lipoma. Lipomas are removed with this method in order to limit incision size and scarring. A  liposuction tube is inserted through a small incision into the lipoma, and the contents of the lipoma are removed through the tube with suction. Follow these instructions at home:  Watch your lipoma for any changes.  Keep all follow-up visits as told by your health care provider. This is important. Contact a health care provider if:  Your lipoma becomes larger or hard.  Your lipoma becomes painful, red, or increasingly swollen. These could be signs of infection or a more serious condition. Get help right away if:  You develop tingling or numbness in an area near the lipoma. This could indicate that the lipoma is causing nerve damage. Summary  A lipoma is a noncancerous tumor that is made up of fat cells.  Most lipomas do not cause problems and do not require treatment.  If a lipoma is bigger or it causes problems, surgery may be done to remove the lipoma.  Contact a health care provider if your lipoma becomes larger or hard, or if it becomes painful, red, or increasingly swollen. Pain, redness, and swelling could be signs of infection or a more serious condition. This information is not intended to replace advice given to you by your health care provider. Make sure you discuss any questions you have with your health care provider. Document Revised: 04/13/2019 Document Reviewed: 04/13/2019 Elsevier Patient Education  2020 [employer]  

## 2020-03-15 NOTE — Progress Notes (Signed)
Edwin Nguyen; [id number]; [birth year]   HPI Patient is a 44 year old white male who was referred to my care by Dr. Allyn Kenner for evaluation treatment of multiple lipomas.  Patient has had excision of lipomas in the remote past.  He has multiple lipomas throughout his body but some of them are starting to be uncomfortable when applying pressure on them or they disrupt his work.  He currently has 0 out of 10 pain with them.  He has had enlargement of the 1 lipoma in the left thigh. Past Medical History:  Diagnosis Date  . Acute bronchitis   . Allergy    seasonal  . Asthma   . Bronchitis   . COVID-19 10/2019    Past Surgical History:  Procedure Laterality Date  . DENTAL SURGERY      Family History  Problem Relation Age of Onset  . Prostate cancer Father   . Colon cancer Father 70  . Colon cancer Brother 53  . Colon polyps Neg Hx   . Esophageal cancer Neg Hx   . Rectal cancer Neg Hx   . Stomach cancer Neg Hx     Current Outpatient Medications on File Prior to Visit  Medication Sig Dispense Refill  . albuterol (PROVENTIL) (2.5 MG/3ML) 0.083% nebulizer solution Take 3 mLs (2.5 mg total) by nebulization every 6 (six) hours as needed for wheezing or shortness of breath. 75 mL 12  . albuterol (VENTOLIN HFA) 108 (90 Base) MCG/ACT inhaler Inhale 2 puffs into the lungs every 6 (six) hours as needed for wheezing or shortness of breath. 16 g 0  . SPIRIVA RESPIMAT 1.25 MCG/ACT AERS SMARTSIG:2 Puff(s) Via Inhaler Daily     No current facility-administered medications on file prior to visit.    No Known Allergies  Social History   Substance and Sexual Activity  Alcohol Use Not Currently    Social History   Tobacco Use  Smoking Status Former Smoker  . Packs/day: 1.00  . Years: 13.00  . Pack years: 13.00  . Quit date: 03/10/2018  . Years since quitting: 2.0  Smokeless Tobacco Never Used  Tobacco Comment   quit 10 weeks ago    Review of Systems  Constitutional: Negative.    HENT: Negative.   Eyes: Negative.   Respiratory: Negative.   Cardiovascular: Negative.   Gastrointestinal: Negative.   Genitourinary: Negative.   Musculoskeletal: Negative.   Skin: Negative.   Neurological: Negative.   Endo/Heme/Allergies: Negative.   Psychiatric/Behavioral: Negative.     Objective   Vitals:   03/15/20 1302  BP: 128/78  Pulse: 78  Resp: 14  Temp: (!) 97.3 F (36.3 C)  SpO2: 96%    Physical Exam Vitals reviewed.  Constitutional:      Appearance: Normal appearance. He is normal weight. He is not ill-appearing.  HENT:     Head: Normocephalic and atraumatic.  Cardiovascular:     Rate and Rhythm: Normal rate and regular rhythm.     Heart sounds: Normal heart sounds. No murmur heard.  No friction rub. No gallop.   Pulmonary:     Effort: Pulmonary effort is normal. No respiratory distress.     Breath sounds: Normal breath sounds. No stridor. No wheezing, rhonchi or rales.  Skin:    General: Skin is warm and dry.     Comments: Patient has multiple subcutaneous, mobile, rubbery soft tissue masses throughout his body.  2 are present in the right forearm, 3 in the back, and one in the left upper  thigh, all of which are causing discomfort.  They measure in size from anywhere from 2 to 6 cm in size.  Neurological:     Mental Status: He is alert and oriented to person, place, and time.   Primary care notes reviewed  Assessment  Multiple lipomas of the right arm, left thigh, and back Plan   Patient is scheduled for excision of multiple lipomas of the right arm, left thigh, and back on 04/08/2020.  The risks and benefits of the procedure including bleeding, infection, and recurrence of lipomas were fully explained to the patient, who gave informed consent.

## 2020-03-16 NOTE — H&P (Signed)
Edwin Nguyen; [id number]; 01/11/1976   HPI Patient is a 44 year old white male who was referred to my care by Dr. Allyn Kenner for evaluation treatment of multiple lipomas.  Patient has had excision of lipomas in the remote past.  He has multiple lipomas throughout his body but some of them are starting to be uncomfortable when applying pressure on them or they disrupt his work.  He currently has 0 out of 10 pain with them.  He has had enlargement of the 1 lipoma in the left thigh. Past Medical History:  Diagnosis Date  . Acute bronchitis   . Allergy    seasonal  . Asthma   . Bronchitis   . COVID-19 10/2019    Past Surgical History:  Procedure Laterality Date  . DENTAL SURGERY      Family History  Problem Relation Age of Onset  . Prostate cancer Father   . Colon cancer Father 32  . Colon cancer Brother 49  . Colon polyps Neg Hx   . Esophageal cancer Neg Hx   . Rectal cancer Neg Hx   . Stomach cancer Neg Hx     Current Outpatient Medications on File Prior to Visit  Medication Sig Dispense Refill  . albuterol (PROVENTIL) (2.5 MG/3ML) 0.083% nebulizer solution Take 3 mLs (2.5 mg total) by nebulization every 6 (six) hours as needed for wheezing or shortness of breath. 75 mL 12  . albuterol (VENTOLIN HFA) 108 (90 Base) MCG/ACT inhaler Inhale 2 puffs into the lungs every 6 (six) hours as needed for wheezing or shortness of breath. 16 g 0  . SPIRIVA RESPIMAT 1.25 MCG/ACT AERS SMARTSIG:2 Puff(s) Via Inhaler Daily     No current facility-administered medications on file prior to visit.    No Known Allergies  Social History   Substance and Sexual Activity  Alcohol Use Not Currently    Social History   Tobacco Use  Smoking Status Former Smoker  . Packs/day: 1.00  . Years: 13.00  . Pack years: 13.00  . Quit date: 03/10/2018  . Years since quitting: 2.0  Smokeless Tobacco Never Used  Tobacco Comment   quit 10 weeks ago    Review of Systems  Constitutional: Negative.    HENT: Negative.   Eyes: Negative.   Respiratory: Negative.   Cardiovascular: Negative.   Gastrointestinal: Negative.   Genitourinary: Negative.   Musculoskeletal: Negative.   Skin: Negative.   Neurological: Negative.   Endo/Heme/Allergies: Negative.   Psychiatric/Behavioral: Negative.     Objective   Vitals:   03/15/20 1302  BP: 128/78  Pulse: 78  Resp: 14  Temp: (!) 97.3 F (36.3 C)  SpO2: 96%    Physical Exam Vitals reviewed.  Constitutional:      Appearance: Normal appearance. He is normal weight. He is not ill-appearing.  HENT:     Head: Normocephalic and atraumatic.  Cardiovascular:     Rate and Rhythm: Normal rate and regular rhythm.     Heart sounds: Normal heart sounds. No murmur heard.  No friction rub. No gallop.   Pulmonary:     Effort: Pulmonary effort is normal. No respiratory distress.     Breath sounds: Normal breath sounds. No stridor. No wheezing, rhonchi or rales.  Skin:    General: Skin is warm and dry.     Comments: Patient has multiple subcutaneous, mobile, rubbery soft tissue masses throughout his body.  2 are present in the right forearm, 3 in the back, and one in the left upper  thigh, all of which are causing discomfort.  They measure in size from anywhere from 2 to 6 cm in size.  Neurological:     Mental Status: He is alert and oriented to person, place, and time.   Primary care notes reviewed  Assessment  Multiple lipomas of the right arm, left thigh, and back Plan   Patient is scheduled for excision of multiple lipomas of the right arm, left thigh, and back on 04/08/2020.  The risks and benefits of the procedure including bleeding, infection, and recurrence of lipomas were fully explained to the patient, who gave informed consent.

## 2020-03-28 ENCOUNTER — Other Ambulatory Visit (HOSPITAL_COMMUNITY)
Admission: RE | Admit: 2020-03-28 | Discharge: 2020-03-28 | Disposition: A | Discharge status: Home / Self Care | Payer: BC Managed Care – PPO | Source: Ambulatory Visit | Attending: General Surgery | Admitting: General Surgery

## 2020-03-28 NOTE — Progress Notes (Signed)
Called patient he said he totally forgot about his appointment today. He is on the [place] on the other side of [place]. Patient is not sure if he can come tomorrow for his COVID test. He's going to call his Dr.'s office that made this appt. and call them to reschedule. Nothing further needed.

## 2020-03-29 ENCOUNTER — Ambulatory Visit (HOSPITAL_COMMUNITY): Admission: RE | Admit: 2020-03-29 | Payer: BC Managed Care – PPO | Source: Ambulatory Visit

## 2020-04-06 ENCOUNTER — Other Ambulatory Visit (HOSPITAL_COMMUNITY)
Admission: RE | Admit: 2020-04-06 | Discharge: 2020-04-06 | Disposition: A | Discharge status: Home / Self Care | Payer: BC Managed Care – PPO | Source: Ambulatory Visit | Attending: General Surgery | Admitting: General Surgery

## 2020-04-06 ENCOUNTER — Other Ambulatory Visit: Payer: Self-pay

## 2020-04-06 ENCOUNTER — Encounter (HOSPITAL_COMMUNITY): Payer: Self-pay

## 2020-04-06 ENCOUNTER — Encounter (HOSPITAL_COMMUNITY)
Admission: RE | Admit: 2020-04-06 | Discharge: 2020-04-06 | Disposition: A | Discharge status: Home / Self Care | Payer: BC Managed Care – PPO | Source: Ambulatory Visit | Attending: General Surgery | Admitting: General Surgery

## 2020-04-06 DIAGNOSIS — Z20822 Contact with and (suspected) exposure to covid-19: Secondary | ICD-10-CM | POA: Insufficient documentation

## 2020-04-06 DIAGNOSIS — Z01812 Encounter for preprocedural laboratory examination: Secondary | ICD-10-CM | POA: Diagnosis not present

## 2020-04-06 NOTE — Patient Instructions (Signed)
Edwin Nguyen  04/06/2020     @PREFPERIOPPHARMACY @   Your procedure is scheduled on 04/08/2020  Report to [facility] at  [facility]  Call this number if you have problems the morning of surgery:  [phone]   Remember:  Do not eat or drink after midnight.                       Take these medicines the morning of surgery with A SIP OF WATER None    Do not wear jewelry, make-up or nail polish.  Do not wear lotions, powders, or perfumes. Please wear deodorant and brush your teeth.  Do not shave 48 hours prior to surgery.  Men may shave face and neck.  Do not bring valuables to the hospital.  [facility] is not responsible for any belongings or valuables.  Contacts, dentures or bridgework may not be worn into surgery.  Leave your suitcase in the car.  After surgery it may be brought to your room.  For patients admitted to the hospital, discharge time will be determined by your treatment team.  Patients discharged the day of surgery will not be allowed to drive home.   Name and phone number of your driver:   family Special instructions:  DO NOT smoke the morning of your procedure.  Please read over the following fact sheets that you were given. Anesthesia Post-op Instructions and Care and Recovery After Surgery       Lipoma Removal, Care After This sheet gives you information about how to care for yourself after your procedure. Your health care provider may also give you more specific instructions. If you have problems or questions, contact your health care provider. What can I expect after the procedure? After the procedure, it is common to have:  Mild pain.  Swelling.  Bruising. Follow these instructions at home: Bathing   Do not take baths, swim, or use a hot tub until your health care provider approves. Ask your health care provider if you may take showers. You may only be allowed to take sponge baths.  Keep your bandage (dressing) dry until  your health care provider says it can be removed. Incision care   Follow instructions from your health care provider about how to take care of your incision. Make sure you: ? Wash your hands with soap and water for at least 20 seconds before and after you change your dressing. If soap and water are not available, use hand sanitizer. ? Change your dressing as told by your health care provider. ? Leave stitches (sutures), skin glue, or adhesive strips in place. These skin closures may need to stay in place for 2 weeks or longer. If adhesive strip edges start to loosen and curl up, you may trim the loose edges. Do not remove adhesive strips completely unless your health care provider tells you to do that.  Check your incision area every day for signs of infection. Check for: ? More redness, swelling, or pain. ? Fluid or blood. ? Warmth. ? Pus or a bad smell. Medicines  Take over-the-counter and prescription medicines only as told by your health care provider.  If you were prescribed an antibiotic medicine, use it as told by your health care provider. Do not stop using the antibiotic even if you start to feel better. General instructions   If you were given a sedative during the procedure, it can affect  you for several hours. Do not drive or operate machinery until your health care provider says that it is safe.  Do not use any products that contain nicotine or tobacco, such as cigarettes, e-cigarettes, and chewing tobacco. These can delay healing. If you need help quitting, ask your health care provider.  Return to your normal activities as told by your health care provider. Ask your health care provider what activities are safe for you.  Keep all follow-up visits as told by your health care provider. This is important. Contact a health care provider if:  You have more redness, swelling, or pain around your incision.  You have fluid or blood coming from your incision.  Your incision  feels warm to the touch.  You have pus or a bad smell coming from your incision.  You have pain that does not get better with medicine. Get help right away if:  You have chills or a fever.  You have severe pain. Summary  After the procedure, it is common to have mild pain, swelling, and bruising.  Follow instructions from your health care provider about how to take care of your incision.  Check your incision area every day for signs of infection.  Contact a health care provider if you have more redness, swelling, or pain around your incision. This information is not intended to replace advice given to you by your health care provider. Make sure you discuss any questions you have with your health care provider. Document Revised: 04/13/2019 Document Reviewed: 04/13/2019 Elsevier Patient Education  [place] After These instructions provide you with information about caring for yourself after your procedure. Your health care provider may also give you more specific instructions. Your treatment has been planned according to current medical practices, but problems sometimes occur. Call your health care provider if you have any problems or questions after your procedure. What can I expect after the procedure? After your procedure, you may:  Feel sleepy for several hours.  Feel clumsy and have poor balance for several hours.  Feel forgetful about what happened after the procedure.  Have poor judgment for several hours.  Feel nauseous or vomit.  Have a sore throat if you had a breathing tube during the procedure. Follow these instructions at home: For at least 24 hours after the procedure:      Have a responsible adult stay with you. It is important to have someone help care for you until you are awake and alert.  Rest as needed.  Do not: ? Participate in activities in which you could fall or become injured. ? Drive. ? Use heavy  machinery. ? Drink alcohol. ? Take sleeping pills or medicines that cause drowsiness. ? Make important decisions or sign legal documents. ? Take care of children on your own. Eating and drinking  Follow the diet that is recommended by your health care provider.  If you vomit, drink water, juice, or soup when you can drink without vomiting.  Make sure you have little or no nausea before eating solid foods. General instructions  Take over-the-counter and prescription medicines only as told by your health care provider.  If you have sleep apnea, surgery and certain medicines can increase your risk for breathing problems. Follow instructions from your health care provider about wearing your sleep device: ? Anytime you are sleeping, including during daytime naps. ? While taking prescription pain medicines, sleeping medicines, or medicines that make you drowsy.  If you smoke,  do not smoke without supervision.  Keep all follow-up visits as told by your health care provider. This is important. Contact a health care provider if:  You keep feeling nauseous or you keep vomiting.  You feel light-headed.  You develop a rash.  You have a fever. Get help right away if:  You have trouble breathing. Summary  For several hours after your procedure, you may feel sleepy and have poor judgment.  Have a responsible adult stay with you for at least 24 hours or until you are awake and alert. This information is not intended to replace advice given to you by your health care provider. Make sure you discuss any questions you have with your health care provider. Document Revised: 11/25/2017 Document Reviewed: 12/18/2015 Elsevier Patient Education  [place]. How to Use Chlorhexidine for Bathing Chlorhexidine gluconate (CHG) is a germ-killing (antiseptic) solution that is used to clean the skin. It can get rid of the bacteria that normally live on the skin and can keep them away for about 24  hours. To clean your skin with CHG, you may be given:  A CHG solution to use in the shower or as part of a sponge bath.  A prepackaged cloth that contains CHG. Cleaning your skin with CHG may help lower the risk for infection:  While you are staying in the intensive care unit of the hospital.  If you have a vascular access, such as a central line, to provide short-term or long-term access to your veins.  If you have a catheter to drain urine from your bladder.  If you are on a ventilator. A ventilator is a machine that helps you breathe by moving air in and out of your lungs.  After surgery. What are the risks? Risks of using CHG include:  A skin reaction.  Hearing loss, if CHG gets in your ears.  Eye injury, if CHG gets in your eyes and is not rinsed out.  The CHG product catching fire. Make sure that you avoid smoking and flames after applying CHG to your skin. Do not use CHG:  If you have a chlorhexidine allergy or have previously reacted to chlorhexidine.  On babies younger than 64 months of age. How to use CHG solution  Use CHG only as told by your health care provider, and follow the instructions on the label.  Use the full amount of CHG as directed. Usually, this is one bottle. During a shower Follow these steps when using CHG solution during a shower (unless your health care provider gives you different instructions): 1. Start the shower. 2. Use your normal soap and shampoo to wash your face and hair. 3. Turn off the shower or move out of the shower stream. 4. Pour the CHG onto a clean washcloth. Do not use any type of brush or rough-edged sponge. 5. Starting at your neck, lather your body down to your toes. Make sure you follow these instructions: ? If you will be having surgery, pay special attention to the part of your body where you will be having surgery. Scrub this area for at least 1 minute. ? Do not use CHG on your head or face. If the solution gets into  your ears or eyes, rinse them well with water. ? Avoid your genital area. ? Avoid any areas of skin that have broken skin, cuts, or scrapes. ? Scrub your back and under your arms. Make sure to wash skin folds. 6. Let the lather sit on your  skin for 1-2 minutes or as long as told by your health care provider. 7. Thoroughly rinse your entire body in the shower. Make sure that all body creases and crevices are rinsed well. 8. Dry off with a clean towel. Do not put any substances on your body afterward--such as powder, lotion, or perfume--unless you are told to do so by your health care provider. Only use lotions that are recommended by the manufacturer. 9. Put on clean clothes or pajamas. 10. If it is the night before your surgery, sleep in clean sheets.  During a sponge bath Follow these steps when using CHG solution during a sponge bath (unless your health care provider gives you different instructions): 1. Use your normal soap and shampoo to wash your face and hair. 2. Pour the CHG onto a clean washcloth. 3. Starting at your neck, lather your body down to your toes. Make sure you follow these instructions: ? If you will be having surgery, pay special attention to the part of your body where you will be having surgery. Scrub this area for at least 1 minute. ? Do not use CHG on your head or face. If the solution gets into your ears or eyes, rinse them well with water. ? Avoid your genital area. ? Avoid any areas of skin that have broken skin, cuts, or scrapes. ? Scrub your back and under your arms. Make sure to wash skin folds. 4. Let the lather sit on your skin for 1-2 minutes or as long as told by your health care provider. 5. Using a different clean, wet washcloth, thoroughly rinse your entire body. Make sure that all body creases and crevices are rinsed well. 6. Dry off with a clean towel. Do not put any substances on your body afterward--such as powder, lotion, or perfume--unless you are  told to do so by your health care provider. Only use lotions that are recommended by the manufacturer. 7. Put on clean clothes or pajamas. 8. If it is the night before your surgery, sleep in clean sheets. How to use CHG prepackaged cloths  Only use CHG cloths as told by your health care provider, and follow the instructions on the label.  Use the CHG cloth on clean, dry skin.  Do not use the CHG cloth on your head or face unless your health care provider tells you to.  When washing with the CHG cloth: ? Avoid your genital area. ? Avoid any areas of skin that have broken skin, cuts, or scrapes. Before surgery Follow these steps when using a CHG cloth to clean before surgery (unless your health care provider gives you different instructions): 1. Using the CHG cloth, vigorously scrub the part of your body where you will be having surgery. Scrub using a back-and-forth motion for 3 minutes. The area on your body should be completely wet with CHG when you are done scrubbing. 2. Do not rinse. Discard the cloth and let the area air-dry. Do not put any substances on the area afterward, such as powder, lotion, or perfume. 3. Put on clean clothes or pajamas. 4. If it is the night before your surgery, sleep in clean sheets.  For general bathing Follow these steps when using CHG cloths for general bathing (unless your health care provider gives you different instructions). 1. Use a separate CHG cloth for each area of your body. Make sure you wash between any folds of skin and between your fingers and toes. Wash your body in the following order, switching  to a new cloth after each step: ? The front of your neck, shoulders, and chest. ? Both of your arms, under your arms, and your hands. ? Your stomach and groin area, avoiding the genitals. ? Your right leg and foot. ? Your left leg and foot. ? The back of your neck, your back, and your buttocks. 2. Do not rinse. Discard the cloth and let the area  air-dry. Do not put any substances on your body afterward--such as powder, lotion, or perfume--unless you are told to do so by your health care provider. Only use lotions that are recommended by the manufacturer. 3. Put on clean clothes or pajamas. Contact a health care provider if:  Your skin gets irritated after scrubbing.  You have questions about using your solution or cloth. Get help right away if:  Your eyes become very red or swollen.  Your eyes itch badly.  Your skin itches badly and is red or swollen.  Your hearing changes.  You have trouble seeing.  You have swelling or tingling in your mouth or throat.  You have trouble breathing.  You swallow any chlorhexidine. Summary  Chlorhexidine gluconate (CHG) is a germ-killing (antiseptic) solution that is used to clean the skin. Cleaning your skin with CHG may help to lower your risk for infection.  You may be given CHG to use for bathing. It may be in a bottle or in a prepackaged cloth to use on your skin. Carefully follow your health care provider's instructions and the instructions on the product label.  Do not use CHG if you have a chlorhexidine allergy.  Contact your health care provider if your skin gets irritated after scrubbing. This information is not intended to replace advice given to you by your health care provider. Make sure you discuss any questions you have with your health care provider. Document Revised: 11/13/2018 Document Reviewed: 07/25/2017 Elsevier Patient Education  [place].

## 2020-04-07 LAB — SARS CORONAVIRUS 2 (TAT 6-24 HRS): SARS Coronavirus 2: NEGATIVE

## 2020-04-08 ENCOUNTER — Ambulatory Visit (HOSPITAL_COMMUNITY): Payer: BC Managed Care – PPO | Admitting: Anesthesiology

## 2020-04-08 ENCOUNTER — Encounter (HOSPITAL_COMMUNITY)
Admission: RE | Disposition: A | Discharge status: Home / Self Care | Payer: Self-pay | Source: Home / Self Care | Attending: General Surgery

## 2020-04-08 ENCOUNTER — Ambulatory Visit (HOSPITAL_COMMUNITY)
Admission: RE | Admit: 2020-04-08 | Discharge: 2020-04-08 | Disposition: A | Discharge status: Home / Self Care | Payer: BC Managed Care – PPO | Attending: General Surgery | Admitting: General Surgery

## 2020-04-08 ENCOUNTER — Encounter (HOSPITAL_COMMUNITY): Payer: Self-pay | Admitting: General Surgery

## 2020-04-08 DIAGNOSIS — Z8616 Personal history of COVID-19: Secondary | ICD-10-CM | POA: Insufficient documentation

## 2020-04-08 DIAGNOSIS — D1724 Benign lipomatous neoplasm of skin and subcutaneous tissue of left leg: Secondary | ICD-10-CM | POA: Diagnosis not present

## 2020-04-08 DIAGNOSIS — Z87891 Personal history of nicotine dependence: Secondary | ICD-10-CM | POA: Insufficient documentation

## 2020-04-08 DIAGNOSIS — Z79899 Other long term (current) drug therapy: Secondary | ICD-10-CM | POA: Diagnosis not present

## 2020-04-08 DIAGNOSIS — J45909 Unspecified asthma, uncomplicated: Secondary | ICD-10-CM | POA: Insufficient documentation

## 2020-04-08 DIAGNOSIS — D171 Benign lipomatous neoplasm of skin and subcutaneous tissue of trunk: Secondary | ICD-10-CM

## 2020-04-08 DIAGNOSIS — D1721 Benign lipomatous neoplasm of skin and subcutaneous tissue of right arm: Secondary | ICD-10-CM | POA: Diagnosis not present

## 2020-04-08 DIAGNOSIS — E882 Lipomatosis, not elsewhere classified: Secondary | ICD-10-CM | POA: Diagnosis not present

## 2020-04-08 HISTORY — PX: LIPOMA EXCISION: SHX5283

## 2020-04-08 SURGERY — EXCISION LIPOMA
Anesthesia: General

## 2020-04-08 MED ORDER — BUPIVACAINE HCL (PF) 0.5 % IJ SOLN
INTRAMUSCULAR | Status: AC
  Filled 2020-04-08: qty 30

## 2020-04-08 MED ORDER — ROCURONIUM BROMIDE 100 MG/10ML IV SOLN
INTRAVENOUS | Status: DC | PRN
  Administered 2020-04-08: 60 mg via INTRAVENOUS

## 2020-04-08 MED ORDER — MIDAZOLAM HCL 2 MG/2ML IJ SOLN
INTRAMUSCULAR | Status: AC
  Filled 2020-04-08: qty 2

## 2020-04-08 MED ORDER — GLYCOPYRROLATE PF 0.2 MG/ML IJ SOSY
PREFILLED_SYRINGE | INTRAMUSCULAR | Status: AC
  Filled 2020-04-08: qty 3

## 2020-04-08 MED ORDER — LIDOCAINE 2% (20 MG/ML) 5 ML SYRINGE
INTRAMUSCULAR | Status: AC
  Filled 2020-04-08: qty 15

## 2020-04-08 MED ORDER — MEPERIDINE HCL 50 MG/ML IJ SOLN: 6.2500 mg | INTRAMUSCULAR | Status: DC | PRN

## 2020-04-08 MED ORDER — HYDROCODONE-ACETAMINOPHEN 5-325 MG PO TABS: 1.0000 | ORAL_TABLET | ORAL | 0 refills | Status: DC | PRN

## 2020-04-08 MED ORDER — SUGAMMADEX SODIUM 200 MG/2ML IV SOLN
INTRAVENOUS | Status: DC | PRN
  Administered 2020-04-08: 200 mg via INTRAVENOUS

## 2020-04-08 MED ORDER — CHLORHEXIDINE GLUCONATE CLOTH 2 % EX PADS: 6.0000 | MEDICATED_PAD | Freq: Once | CUTANEOUS | Status: DC

## 2020-04-08 MED ORDER — BUPIVACAINE HCL (PF) 0.5 % IJ SOLN
INTRAMUSCULAR | Status: DC | PRN
  Administered 2020-04-08: 18 mL

## 2020-04-08 MED ORDER — ROCURONIUM BROMIDE 10 MG/ML (PF) SYRINGE
PREFILLED_SYRINGE | INTRAVENOUS | Status: AC
  Filled 2020-04-08: qty 10

## 2020-04-08 MED ORDER — LIDOCAINE HCL (CARDIAC) PF 100 MG/5ML IV SOSY
PREFILLED_SYRINGE | INTRAVENOUS | Status: DC | PRN
  Administered 2020-04-08: 100 mg via INTRAVENOUS

## 2020-04-08 MED ORDER — BACITRACIN ZINC 500 UNIT/GM EX OINT
TOPICAL_OINTMENT | CUTANEOUS | Status: AC
  Filled 2020-04-08: qty 2.7

## 2020-04-08 MED ORDER — PROPOFOL 10 MG/ML IV BOLUS
INTRAVENOUS | Status: AC
  Filled 2020-04-08: qty 40

## 2020-04-08 MED ORDER — PHENYLEPHRINE HCL (PRESSORS) 10 MG/ML IV SOLN
INTRAVENOUS | Status: DC | PRN
  Administered 2020-04-08: 120 ug via INTRAVENOUS

## 2020-04-08 MED ORDER — LACTATED RINGERS IV SOLN: Freq: Once | INTRAVENOUS | Status: AC

## 2020-04-08 MED ORDER — PHENYLEPHRINE 40 MCG/ML (10ML) SYRINGE FOR IV PUSH (FOR BLOOD PRESSURE SUPPORT)
PREFILLED_SYRINGE | INTRAVENOUS | Status: AC
  Filled 2020-04-08: qty 10

## 2020-04-08 MED ORDER — DEXAMETHASONE SODIUM PHOSPHATE 10 MG/ML IJ SOLN
INTRAMUSCULAR | Status: AC
  Filled 2020-04-08: qty 1

## 2020-04-08 MED ORDER — HYDROMORPHONE HCL 1 MG/ML IJ SOLN: 0.2500 mg | INTRAMUSCULAR | Status: DC | PRN

## 2020-04-08 MED ORDER — EPHEDRINE 5 MG/ML INJ
INTRAVENOUS | Status: AC
  Filled 2020-04-08: qty 10

## 2020-04-08 MED ORDER — DEXAMETHASONE SODIUM PHOSPHATE 4 MG/ML IJ SOLN
INTRAMUSCULAR | Status: DC | PRN
  Administered 2020-04-08: 10 mg via INTRAVENOUS

## 2020-04-08 MED ORDER — LACTATED RINGERS IV SOLN
INTRAVENOUS | Status: DC | PRN
Start: 2020-04-08 — End: 2020-04-08

## 2020-04-08 MED ORDER — GLYCOPYRROLATE 0.2 MG/ML IJ SOLN
INTRAMUSCULAR | Status: DC | PRN
  Administered 2020-04-08: .2 mg via INTRAVENOUS

## 2020-04-08 MED ORDER — ONDANSETRON HCL 4 MG/2ML IJ SOLN
INTRAMUSCULAR | Status: AC
  Filled 2020-04-08: qty 2

## 2020-04-08 MED ORDER — FENTANYL CITRATE (PF) 100 MCG/2ML IJ SOLN
INTRAMUSCULAR | Status: DC | PRN
  Administered 2020-04-08 (×3): 50 ug via INTRAVENOUS

## 2020-04-08 MED ORDER — MIDAZOLAM HCL 2 MG/2ML IJ SOLN
INTRAMUSCULAR | Status: DC | PRN
  Administered 2020-04-08 (×2): 1 mg via INTRAVENOUS

## 2020-04-08 MED ORDER — CHLORHEXIDINE GLUCONATE 0.12 % MT SOLN
15.0000 mL | Freq: Once | OROMUCOSAL | Status: AC
  Administered 2020-04-08: 15 mL via OROMUCOSAL
  Filled 2020-04-08: qty 15

## 2020-04-08 MED ORDER — FENTANYL CITRATE (PF) 250 MCG/5ML IJ SOLN
INTRAMUSCULAR | Status: AC
  Filled 2020-04-08: qty 5

## 2020-04-08 MED ORDER — PROMETHAZINE HCL 25 MG/ML IJ SOLN: 6.2500 mg | INTRAMUSCULAR | Status: DC | PRN

## 2020-04-08 MED ORDER — ORAL CARE MOUTH RINSE: 15.0000 mL | Freq: Once | OROMUCOSAL | Status: AC

## 2020-04-08 MED ORDER — PROPOFOL 10 MG/ML IV BOLUS
INTRAVENOUS | Status: DC | PRN
  Administered 2020-04-08: 40 mg via INTRAVENOUS
  Administered 2020-04-08: 200 mg via INTRAVENOUS

## 2020-04-08 MED ORDER — KETOROLAC TROMETHAMINE 30 MG/ML IJ SOLN
30.0000 mg | Freq: Once | INTRAMUSCULAR | Status: AC
  Administered 2020-04-08: 30 mg via INTRAVENOUS
  Filled 2020-04-08: qty 1

## 2020-04-08 MED ORDER — EPHEDRINE SULFATE 50 MG/ML IJ SOLN
INTRAMUSCULAR | Status: DC | PRN
  Administered 2020-04-08: 10 mg via INTRAVENOUS

## 2020-04-08 MED ORDER — ONDANSETRON HCL 4 MG/2ML IJ SOLN
INTRAMUSCULAR | Status: DC | PRN
  Administered 2020-04-08: 4 mg via INTRAVENOUS

## 2020-04-08 MED ORDER — 0.9 % SODIUM CHLORIDE (POUR BTL) OPTIME
TOPICAL | Status: DC | PRN
  Administered 2020-04-08: 1000 mL

## 2020-04-08 SURGICAL SUPPLY — 29 items
ADH SKN CLS APL DERMABOND .7 (GAUZE/BANDAGES/DRESSINGS) ×4
CLOTH BEACON ORANGE TIMEOUT ST (SAFETY) ×2 IMPLANT
COVER LIGHT HANDLE STERIS (MISCELLANEOUS) ×4 IMPLANT
COVER WAND RF STERILE (DRAPES) ×2 IMPLANT
DECANTER SPIKE VIAL GLASS SM (MISCELLANEOUS) ×2 IMPLANT
DERMABOND ADVANCED (GAUZE/BANDAGES/DRESSINGS) ×4
DERMABOND ADVANCED .7 DNX12 (GAUZE/BANDAGES/DRESSINGS) ×4 IMPLANT
ELECT NEEDLE TIP 2.8 STRL (NEEDLE) IMPLANT
ELECT REM PT RETURN 9FT ADLT (ELECTROSURGICAL) ×2
ELECTRODE REM PT RTRN 9FT ADLT (ELECTROSURGICAL) ×1 IMPLANT
GLOVE BIOGEL PI IND STRL 7.0 (GLOVE) ×2 IMPLANT
GLOVE BIOGEL PI INDICATOR 7.0 (GLOVE) ×2
GLOVE SURG SS PI 7.5 STRL IVOR (GLOVE) ×2 IMPLANT
GOWN STRL REUS W/TWL LRG LVL3 (GOWN DISPOSABLE) ×4 IMPLANT
KIT TURNOVER KIT A (KITS) ×2 IMPLANT
MANIFOLD NEPTUNE II (INSTRUMENTS) ×2 IMPLANT
NEEDLE HYPO 25X1 1.5 SAFETY (NEEDLE) ×2 IMPLANT
NS IRRIG 1000ML POUR BTL (IV SOLUTION) ×2 IMPLANT
PACK MINOR (CUSTOM PROCEDURE TRAY) ×2 IMPLANT
PAD ARMBOARD 7.5X6 YLW CONV (MISCELLANEOUS) ×2 IMPLANT
SET BASIN LINEN APH (SET/KITS/TRAYS/PACK) ×2 IMPLANT
SOL PREP PROV IODINE SCRUB 4OZ (MISCELLANEOUS) ×2 IMPLANT
SUT ETHILON 3 0 FSL (SUTURE) IMPLANT
SUT MNCRL AB 4-0 PS2 18 (SUTURE) ×6 IMPLANT
SUT PROLENE 4 0 PS 2 18 (SUTURE) IMPLANT
SUT VIC AB 3-0 SH 27 (SUTURE)
SUT VIC AB 3-0 SH 27X BRD (SUTURE) IMPLANT
SYR CONTROL 10ML LL (SYRINGE) ×2 IMPLANT
TOWEL OR 17X26 4PK STRL BLUE (TOWEL DISPOSABLE) ×2 IMPLANT

## 2020-04-08 NOTE — Op Note (Signed)
Patient:  Edwin Nguyen  DOB:  [birth year]  MRN:  [id number]   Preop Diagnosis: Multiple subcutaneous masses, right arm x2, back x3, left thigh  Postop Diagnosis: Same, lipomas  Procedure: Excision of multiple subcutaneous masses, right arm x2, back x3, left thigh  Surgeon: Aviva Signs, MD  Anes: General  Indications: Patient is a 44 year old white male who presents with multiple subcutaneous masses.  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  There are various stages of growth and discomfort.  The risks and benefits of the procedure including bleeding, infection, and recurrence of the lipomas were fully explained to the patient, who gave informed consent.  Procedure note: After general anesthesia was administered, the patient was positioned multiple ways throughout the case in order to excise the subcutaneous masses that were preoperatively marked.  All the masses subsequently appeared to grossly be lipomas.  The incisional sizes included left back 4 cm, mid back 4.5 cm, right back 2 cm, right arm x2 2 cm, and left thigh 3 cm.  All were sent to pathology for further examination.  A bleeding was controlled using Bovie electrocautery.  0.5 Sensorcaine was instilled into all the incisions.  All incisions were closed using 4-0 Monocryl subcuticular sutures.  Dermabond was applied.  All tape and needle counts were correct at the end of the procedures.  The patient was extubated in the operating room and transferred to PACU in stable condition.  Complications: None  EBL: Minimal  Specimen: Left back, mid back, right back, right arm x2, left thigh

## 2020-04-08 NOTE — Anesthesia Procedure Notes (Signed)
Procedure Name: Intubation Date/Time: 04/08/2020 7:39 AM Performed by: Georgeanne Nim, CRNA Pre-anesthesia Checklist: Patient identified, Emergency Drugs available, Suction available, Patient being monitored and Timeout performed Patient Re-evaluated:Patient Re-evaluated prior to induction Oxygen Delivery Method: Circle system utilized Preoxygenation: Pre-oxygenation with 100% oxygen Induction Type: IV induction Ventilation: Mask ventilation without difficulty Laryngoscope Size: Mac and 4 Grade View: Grade I Tube type: Oral Tube size: 7.5 mm Number of attempts: 1 Airway Equipment and Method: Stylet Placement Confirmation: ETT inserted through vocal cords under direct vision,  positive ETCO2,  CO2 detector and breath sounds checked- equal and bilateral Secured at: 23 cm Tube secured with: Tape Dental Injury: Teeth and Oropharynx as per pre-operative assessment

## 2020-04-08 NOTE — Anesthesia Preprocedure Evaluation (Signed)
Anesthesia Evaluation  Patient identified by MRN, date of birth, ID band Patient awake    Reviewed: Allergy & Precautions, NPO status , Patient's Chart, lab work & pertinent test results  History of Anesthesia Complications Negative for: history of anesthetic complications  Airway Mallampati: II  TM Distance: >3 FB Neck ROM: Full    Dental  (+) Teeth Intact, Chipped, Dental Advisory Given,  Cracked upper tooth:   Pulmonary asthma , pneumonia, former smoker,    Pulmonary exam normal breath sounds clear to auscultation       Cardiovascular Exercise Tolerance: Good Normal cardiovascular exam Rhythm:Regular Rate:Normal     Neuro/Psych negative neurological ROS  negative psych ROS   GI/Hepatic negative GI ROS, Neg liver ROS,   Endo/Other  negative endocrine ROS  Renal/GU negative Renal ROS     Musculoskeletal negative musculoskeletal ROS (+)   Abdominal   Peds  Hematology negative hematology ROS (+)   Anesthesia Other Findings   Reproductive/Obstetrics negative OB ROS                             Anesthesia Physical Anesthesia Plan  ASA: II  Anesthesia Plan: General   Post-op Pain Management:    Induction:   PONV Risk Score and Plan: 3 and Ondansetron, Dexamethasone and Midazolam  Airway Management Planned: Oral ETT  Additional Equipment:   Intra-op Plan:   Post-operative Plan: Extubation in OR  Informed Consent:   Plan Discussed with: CRNA and Surgeon  Anesthesia Plan Comments:         Anesthesia Quick Evaluation

## 2020-04-08 NOTE — Anesthesia Postprocedure Evaluation (Signed)
Anesthesia Post Note  Patient: Edwin Nguyen  Procedure(s) Performed: EXCISION LIPOMA, Right arm x2, Back x 3, Left thigh x 1 (N/A )  Patient location during evaluation: PACU Anesthesia Type: General Level of consciousness: awake and alert Pain management: pain level controlled Vital Signs Assessment: post-procedure vital signs reviewed and stable Respiratory status: spontaneous breathing Cardiovascular status: stable Postop Assessment: no apparent nausea or vomiting Anesthetic complications: no   No complications documented.   Last Vitals:  Vitals:   04/08/20 0908 04/08/20 0915  BP: (!) 115/60 109/78  Pulse: 90 101  Resp: 14 15  Temp: (!) 36.3 C   SpO2: 99% 99%    Last Pain:  Vitals:   04/08/20 0908  TempSrc:   PainSc: 0-No pain                 Everette Rank

## 2020-04-08 NOTE — Interval H&P Note (Signed)
History and Physical Interval Note:  04/08/2020 7:14 AM  Edwin Nguyen  has presented today for surgery, with the diagnosis of Lipoma, back x 3 Lipoma, Right arm, x2 Lipoma, Left thigh, x1.  The various methods of treatment have been discussed with the patient and family. After consideration of risks, benefits and other options for treatment, the patient has consented to  Procedure(s): EXCISION LIPOMA, Right arm x2, Back x 3, Left thigh x 1 (N/A) as a surgical intervention.  The patient's history has been reviewed, patient examined, no change in status, stable for surgery.  I have reviewed the patient's chart and labs.  Questions were answered to the patient's satisfaction.     Aviva Signs

## 2020-04-08 NOTE — Transfer of Care (Signed)
Immediate Anesthesia Transfer of Care Note  Patient: Edwin Nguyen  Procedure(s) Performed: EXCISION LIPOMA, Right arm x2, Back x 3, Left thigh x 1 (N/A )  Patient Location: PACU  Anesthesia Type:General  Level of Consciousness: awake, alert , oriented and patient cooperative  Airway & Oxygen Therapy: Patient Spontanous Breathing  Post-op Assessment: Report given to RN and Post -op Vital signs reviewed and stable  Post vital signs: Reviewed and stable  Last Vitals:  Vitals Value Taken Time  BP 109/78 04/08/20 0915  Temp 36.3 C 04/08/20 0908  Pulse 90 04/08/20 0916  Resp 14 04/08/20 0916  SpO2 99 % 04/08/20 0916  Vitals shown include unvalidated device data.  Last Pain:  Vitals:   04/08/20 0908  TempSrc:   PainSc: 0-No pain      Patients Stated Pain Goal: 6 (55/01/58 6825)  Complications: No complications documented.

## 2020-04-08 NOTE — Discharge Instructions (Signed)
     Lipoma Removal, Care After This sheet gives you information about how to care for yourself after your procedure. Your health care provider may also give you more specific instructions. If you have problems or questions, contact your health care provider. What can I expect after the procedure? After the procedure, it is common to have:  Mild pain.  Swelling.  Bruising. Follow these instructions at home: Bathing   Do not take baths, swim, or use a hot tub until your health care provider approves. Ask your health care provider if you may take showers. You may only be allowed to take sponge baths.  Keep your bandage (dressing) dry until your health care provider says it can be removed. Incision care   Follow instructions from your health care provider about how to take care of your incision. Make sure you: ? Wash your hands with soap and water for at least 20 seconds before and after you change your dressing. If soap and water are not available, use hand sanitizer. ? Change your dressing as told by your health care provider. ? Leave stitches (sutures), skin glue, or adhesive strips in place. These skin closures may need to stay in place for 2 weeks or longer. If adhesive strip edges start to loosen and curl up, you may trim the loose edges. Do not remove adhesive strips completely unless your health care provider tells you to do that.  Check your incision area every day for signs of infection. Check for: ? More redness, swelling, or pain. ? Fluid or blood. ? Warmth. ? Pus or a bad smell. Medicines  Take over-the-counter and prescription medicines only as told by your health care provider.  If you were prescribed an antibiotic medicine, use it as told by your health care provider. Do not stop using the antibiotic even if you start to feel better. General instructions   If you were given a sedative during the procedure, it can affect you for several hours. Do not drive or  operate machinery until your health care provider says that it is safe.  Do not use any products that contain nicotine or tobacco, such as cigarettes, e-cigarettes, and chewing tobacco. These can delay healing. If you need help quitting, ask your health care provider.  Return to your normal activities as told by your health care provider. Ask your health care provider what activities are safe for you.  Keep all follow-up visits as told by your health care provider. This is important. Contact a health care provider if:  You have more redness, swelling, or pain around your incision.  You have fluid or blood coming from your incision.  Your incision feels warm to the touch.  You have pus or a bad smell coming from your incision.  You have pain that does not get better with medicine. Get help right away if:  You have chills or a fever.  You have severe pain. Summary  After the procedure, it is common to have mild pain, swelling, and bruising.  Follow instructions from your health care provider about how to take care of your incision.  Check your incision area every day for signs of infection.  Contact a health care provider if you have more redness, swelling, or pain around your incision. This information is not intended to replace advice given to you by your health care provider. Make sure you discuss any questions you have with your health care provider. Document Revised: 04/13/2019 Document Reviewed: 04/13/2019 Elsevier   Patient Education  2020 Elsevier Inc.    General Anesthesia, Adult, Care After This sheet gives you information about how to care for yourself after your procedure. Your health care provider may also give you more specific instructions. If you have problems or questions, contact your health care provider. What can I expect after the procedure? After the procedure, the following side effects are common:  Pain or discomfort at the IV  site.  Nausea.  Vomiting.  Sore throat.  Trouble concentrating.  Feeling cold or chills.  Weak or tired.  Sleepiness and fatigue.  Soreness and body aches. These side effects can affect parts of the body that were not involved in surgery. Follow these instructions at home:  For at least 24 hours after the procedure:  Have a responsible adult stay with you. It is important to have someone help care for you until you are awake and alert.  Rest as needed.  Do not: ? Participate in activities in which you could fall or become injured. ? Drive. ? Use heavy machinery. ? Drink alcohol. ? Take sleeping pills or medicines that cause drowsiness. ? Make important decisions or sign legal documents. ? Take care of children on your own. Eating and drinking  Follow any instructions from your health care provider about eating or drinking restrictions.  When you feel hungry, start by eating small amounts of foods that are soft and easy to digest (bland), such as toast. Gradually return to your regular diet.  Drink enough fluid to keep your urine pale yellow.  If you vomit, rehydrate by drinking water, juice, or clear broth. General instructions  If you have sleep apnea, surgery and certain medicines can increase your risk for breathing problems. Follow instructions from your health care provider about wearing your sleep device: ? Anytime you are sleeping, including during daytime naps. ? While taking prescription pain medicines, sleeping medicines, or medicines that make you drowsy.  Return to your normal activities as told by your health care provider. Ask your health care provider what activities are safe for you.  Take over-the-counter and prescription medicines only as told by your health care provider.  If you smoke, do not smoke without supervision.  Keep all follow-up visits as told by your health care provider. This is important. Contact a health care provider if:  You  have nausea or vomiting that does not get better with medicine.  You cannot eat or drink without vomiting.  You have pain that does not get better with medicine.  You are unable to pass urine.  You develop a skin rash.  You have a fever.  You have redness around your IV site that gets worse. Get help right away if:  You have difficulty breathing.  You have chest pain.  You have blood in your urine or stool, or you vomit blood. Summary  After the procedure, it is common to have a sore throat or nausea. It is also common to feel tired.  Have a responsible adult stay with you for the first 24 hours after general anesthesia. It is important to have someone help care for you until you are awake and alert.  When you feel hungry, start by eating small amounts of foods that are soft and easy to digest (bland), such as toast. Gradually return to your regular diet.  Drink enough fluid to keep your urine pale yellow.  Return to your normal activities as told by your health care provider. Ask your health care provider   what activities are safe for you. This information is not intended to replace advice given to you by your health care provider. Make sure you discuss any questions you have with your health care provider. Document Revised: 08/30/2017 Document Reviewed: 04/12/2017 Elsevier Patient Education  2020 Elsevier Inc.   

## 2020-04-11 ENCOUNTER — Encounter (HOSPITAL_COMMUNITY): Payer: Self-pay | Admitting: General Surgery

## 2020-04-11 LAB — SURGICAL PATHOLOGY

## 2020-04-19 NOTE — Progress Notes (Signed)
04-19-2020 12:58 pm Late entry.  Please excuse Edwin Nguyen. [place] from work from July 30,2021 through April 15, 2020. 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  He may follow up with his physician for return to work.

## 2020-04-19 NOTE — Progress Notes (Signed)
04-19-2020 12:55pm, late entry. Patient called to short stay requesting a work note for his employer from 04/08/2020 through 04-15-2020, post surgical coverage for employer. He will pick up note on 04-20-2020 at [facility] registration desk.

## 2020-04-20 ENCOUNTER — Encounter: Payer: Self-pay | Admitting: Family Medicine

## 2020-04-22 ENCOUNTER — Telehealth: Payer: Self-pay | Admitting: General Surgery

## 2020-05-04 DIAGNOSIS — J4541 Moderate persistent asthma with (acute) exacerbation: Secondary | ICD-10-CM | POA: Diagnosis not present

## 2020-12-01 IMAGING — DX DG CHEST 2V
2 series · 2 of 2 positions shown · non-contrast
Comparison: June 05, 2018

CLINICAL DATA: Cough and shortness of breath

EXAM:
CHEST - 2 VIEW

[chest pa]
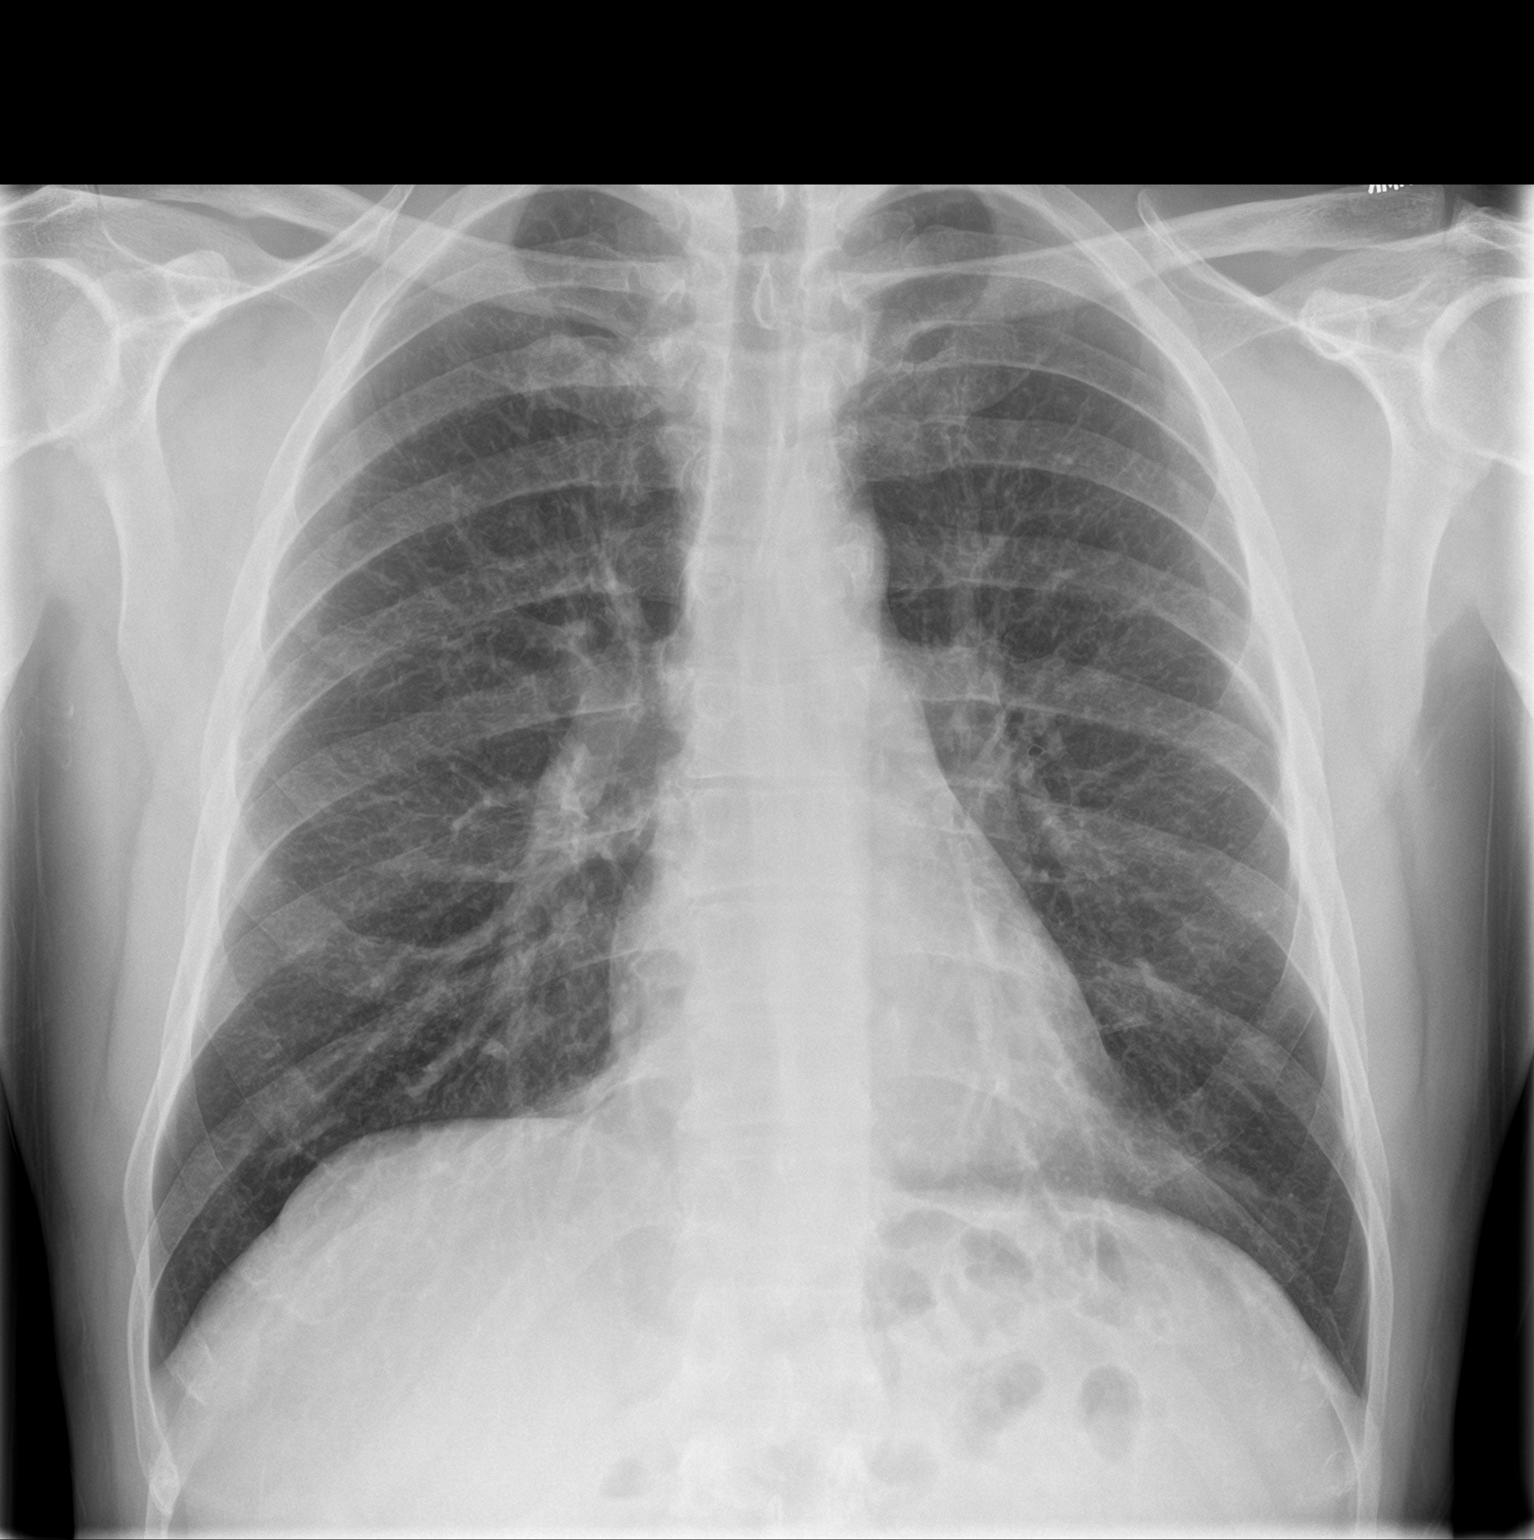

[chest lat]
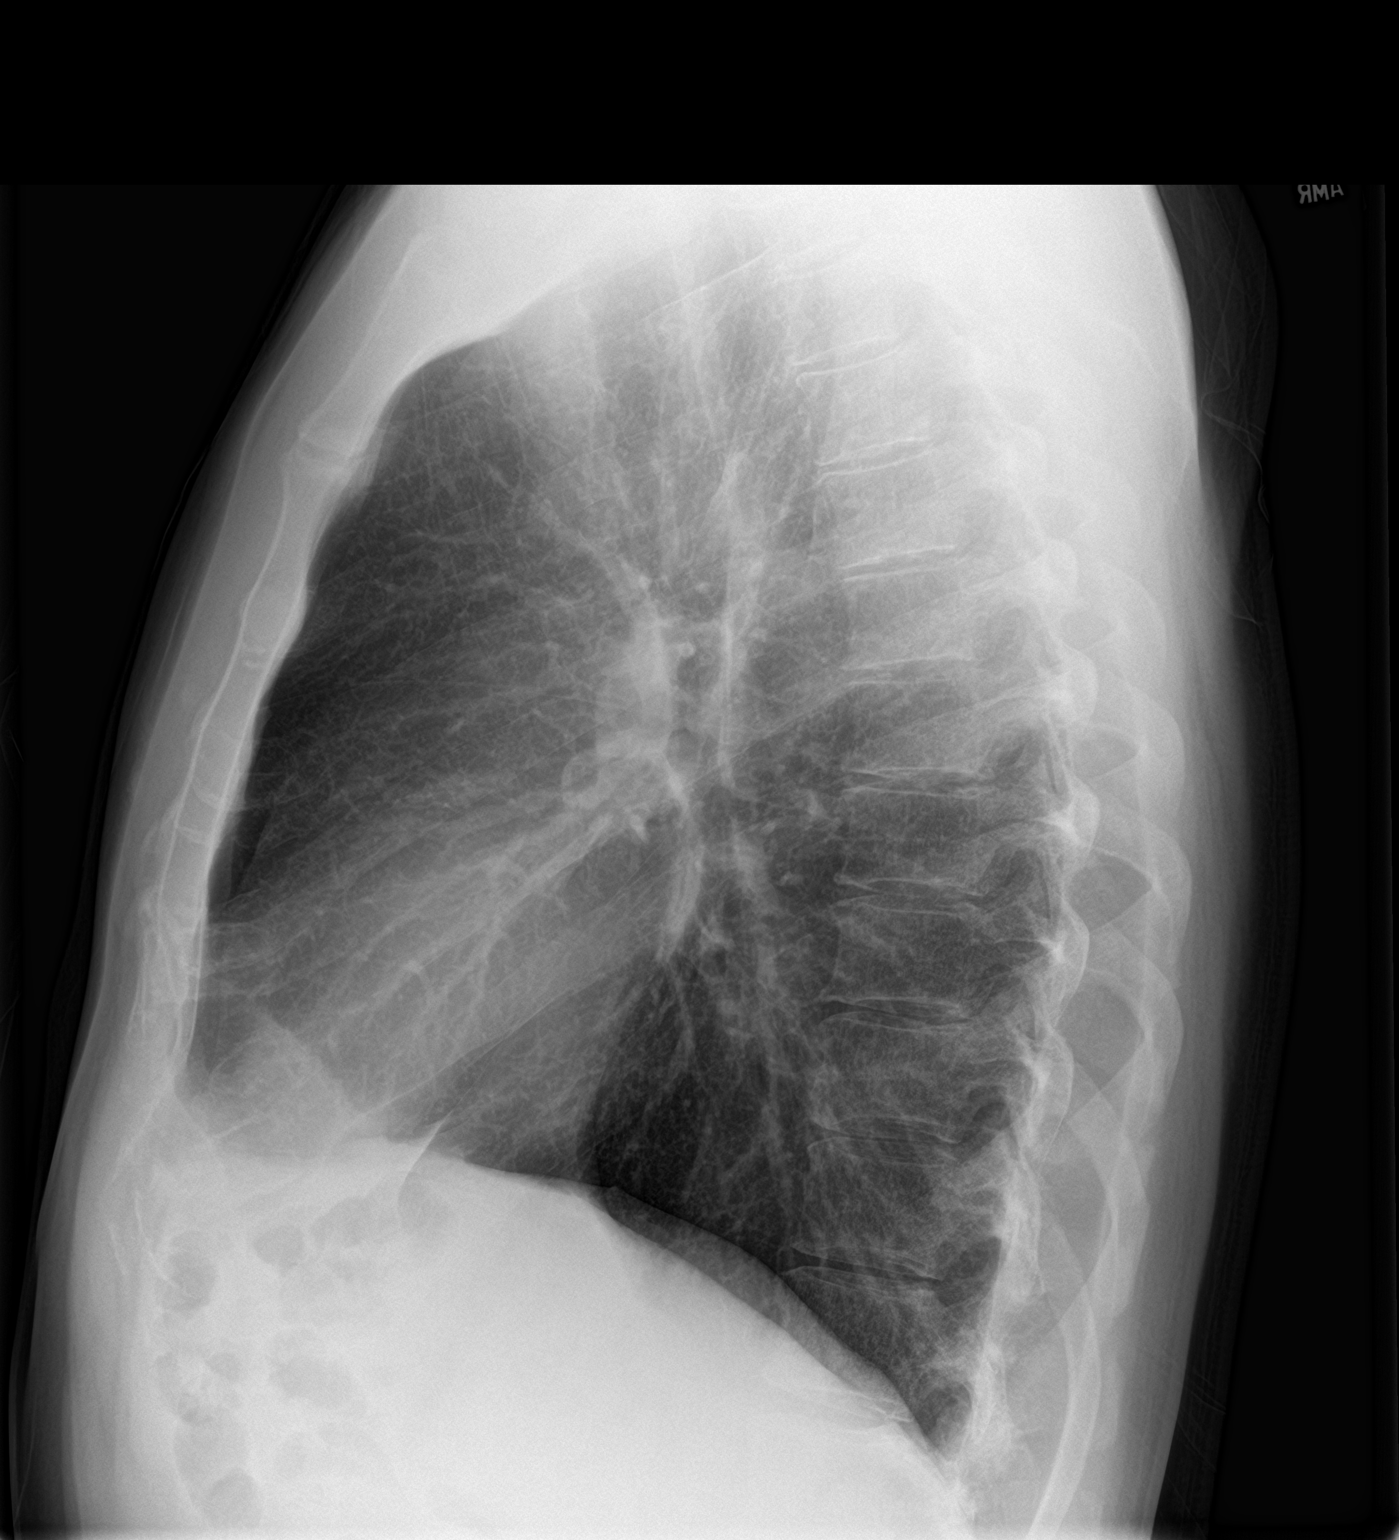

[2 of 2 positions shown; findings below may reference images not displayed]

FINDINGS: There is no evident edema or airspace opacity. Lungs remain mildly
hyperexpanded. Heart size and pulmonary vascularity are normal. No
adenopathy. No bone lesions.
IMPRESSION: Lungs mildly hyperexpanded without edema or airspace opacity.
Cardiac silhouette within normal limits.

## 2021-02-17 DIAGNOSIS — Z0000 Encounter for general adult medical examination without abnormal findings: Secondary | ICD-10-CM | POA: Diagnosis not present

## 2021-02-21 ENCOUNTER — Other Ambulatory Visit (HOSPITAL_COMMUNITY): Payer: Self-pay | Admitting: Student

## 2021-02-21 DIAGNOSIS — J4540 Moderate persistent asthma, uncomplicated: Secondary | ICD-10-CM | POA: Diagnosis not present

## 2021-02-21 DIAGNOSIS — G2581 Restless legs syndrome: Secondary | ICD-10-CM | POA: Diagnosis not present

## 2021-02-21 DIAGNOSIS — J302 Other seasonal allergic rhinitis: Secondary | ICD-10-CM | POA: Diagnosis not present

## 2021-02-21 DIAGNOSIS — D171 Benign lipomatous neoplasm of skin and subcutaneous tissue of trunk: Secondary | ICD-10-CM | POA: Diagnosis not present

## 2021-02-22 ENCOUNTER — Other Ambulatory Visit (HOSPITAL_COMMUNITY): Payer: Self-pay | Admitting: Student

## 2021-02-22 DIAGNOSIS — M79672 Pain in left foot: Secondary | ICD-10-CM

## 2021-02-22 DIAGNOSIS — M79671 Pain in right foot: Secondary | ICD-10-CM

## 2021-03-29 DIAGNOSIS — T1501XA Foreign body in cornea, right eye, initial encounter: Secondary | ICD-10-CM | POA: Diagnosis not present

## 2021-03-31 DIAGNOSIS — T1501XD Foreign body in cornea, right eye, subsequent encounter: Secondary | ICD-10-CM | POA: Diagnosis not present

## 2022-04-23 DIAGNOSIS — Z0000 Encounter for general adult medical examination without abnormal findings: Secondary | ICD-10-CM | POA: Diagnosis not present

## 2022-04-23 DIAGNOSIS — Z136 Encounter for screening for cardiovascular disorders: Secondary | ICD-10-CM | POA: Diagnosis not present

## 2022-05-09 DIAGNOSIS — E559 Vitamin D deficiency, unspecified: Secondary | ICD-10-CM | POA: Diagnosis not present

## 2022-05-09 DIAGNOSIS — Z0001 Encounter for general adult medical examination with abnormal findings: Secondary | ICD-10-CM | POA: Diagnosis not present

## 2022-05-09 DIAGNOSIS — R5383 Other fatigue: Secondary | ICD-10-CM | POA: Diagnosis not present

## 2022-08-27 ENCOUNTER — Other Ambulatory Visit (HOSPITAL_COMMUNITY): Payer: Self-pay | Admitting: Family Medicine

## 2022-08-27 ENCOUNTER — Ambulatory Visit (HOSPITAL_COMMUNITY)
Admission: RE | Admit: 2022-08-27 | Discharge: 2022-08-27 | Disposition: A | Discharge status: Home / Self Care | Payer: BC Managed Care – PPO | Source: Ambulatory Visit | Attending: Family Medicine | Admitting: Family Medicine

## 2022-08-27 DIAGNOSIS — J069 Acute upper respiratory infection, unspecified: Secondary | ICD-10-CM | POA: Insufficient documentation

## 2022-08-27 DIAGNOSIS — R059 Cough, unspecified: Secondary | ICD-10-CM | POA: Diagnosis not present

## 2022-08-27 DIAGNOSIS — R5383 Other fatigue: Secondary | ICD-10-CM | POA: Diagnosis not present

## 2023-02-11 ENCOUNTER — Encounter: Payer: Self-pay | Admitting: Gastroenterology

## 2023-02-19 ENCOUNTER — Encounter: Payer: Self-pay | Admitting: Gastroenterology

## 2023-03-19 ENCOUNTER — Ambulatory Visit (AMBULATORY_SURGERY_CENTER): Payer: BC Managed Care – PPO

## 2023-03-19 ENCOUNTER — Encounter: Payer: Self-pay | Admitting: Gastroenterology

## 2023-03-19 VITALS — Ht 73.0 in | Wt 205.0 lb

## 2023-03-19 DIAGNOSIS — Z86010 Personal history of colonic polyps: Secondary | ICD-10-CM

## 2023-03-19 DIAGNOSIS — Z800 Family history of malignant neoplasm of digestive organs: Secondary | ICD-10-CM

## 2023-03-19 MED ORDER — NA SULFATE-K SULFATE-MG SULF 17.5-3.13-1.6 GM/177ML PO SOLN: 1.0000 | Freq: Once | ORAL | 0 refills | Status: AC

## 2023-03-19 NOTE — Progress Notes (Signed)
No egg or soy allergy known to patient  No issues known to pt with past sedation with any surgeries or procedures Patient denies ever being told they had issues or difficulty with intubation  No FH of Malignant Hyperthermia Pt is not on diet pills Pt is not on  home 02  Pt is not on blood thinners  Pt denies issues with constipation  No A fib or A flutter Have any cardiac testing pending--no  LOA: independent  Prep: suprep   Patient's chart reviewed by John Nulty CNRA prior to previsit and patient appropriate for the [facility].  Previsit completed and red dot placed by patient's name on their procedure day (on provider's schedule).     PV competed with patient. Prep instructions sent via mychart and home address. Goodrx coupon for [facility] provided to use for price reduction if needed.  

## 2023-04-01 ENCOUNTER — Encounter: Payer: Self-pay | Admitting: Gastroenterology

## 2023-04-01 ENCOUNTER — Ambulatory Visit (AMBULATORY_SURGERY_CENTER): Payer: BC Managed Care – PPO | Admitting: Gastroenterology

## 2023-04-01 VITALS — BP 102/68 | HR 75 | Temp 99.6°F | Resp 11 | Ht 73.0 in | Wt 205.0 lb

## 2023-04-01 DIAGNOSIS — Z09 Encounter for follow-up examination after completed treatment for conditions other than malignant neoplasm: Secondary | ICD-10-CM

## 2023-04-01 DIAGNOSIS — Z86010 Personal history of colonic polyps: Secondary | ICD-10-CM

## 2023-04-01 DIAGNOSIS — Z1211 Encounter for screening for malignant neoplasm of colon: Secondary | ICD-10-CM | POA: Diagnosis not present

## 2023-04-01 DIAGNOSIS — Z800 Family history of malignant neoplasm of digestive organs: Secondary | ICD-10-CM

## 2023-04-01 MED ORDER — SODIUM CHLORIDE 0.9 % IV SOLN: 500.0000 mL | Freq: Once | INTRAVENOUS | Status: DC

## 2023-04-01 NOTE — Progress Notes (Signed)
VS completed by SS  Pt's states no medical or surgical changes since previsit or office visit.

## 2023-04-01 NOTE — Patient Instructions (Addendum)
Resume previous diet Continue present medications Handouts/information given for diverticulosis and hemorrhoids  There were no colon polyps seen today!  You will need another screening colonoscopy in 5 years, you will receive a letter at that time when you are due for the procedure.   Please call us at [phone] if you have a change in bowel habits, change in family history of colo-rectal cancer, rectal bleeding or other GI concern before that time.  YOU HAD AN ENDOSCOPIC PROCEDURE TODAY AT THE [facility]:   Refer to the procedure report that was given to you for any specific questions about what was found during the examination.  If the procedure report does not answer your questions, please call your gastroenterologist to clarify.  If you requested that your care partner not be given the details of your procedure findings, then the procedure report has been included in a sealed envelope for you to review at your convenience later.  YOU SHOULD EXPECT: Some feelings of bloating in the abdomen. Passage of more gas than usual.  Walking can help get rid of the air that was put into your GI tract during the procedure and reduce the bloating. If you had a lower endoscopy (such as a colonoscopy or flexible sigmoidoscopy) you may notice spotting of blood in your stool or on the toilet paper. If you underwent a bowel prep for your procedure, you may not have a normal bowel movement for a few days.  Please Note:  You might notice some irritation and congestion in your nose or some drainage.  This is from the oxygen used during your procedure.  There is no need for concern and it should clear up in a day or so.  SYMPTOMS TO REPORT IMMEDIATELY:  Following lower endoscopy (colonoscopy):  Excessive amounts of blood in the stool  Significant tenderness or worsening of abdominal pains  Swelling of the abdomen that is new, acute  Fever of 100F or higher  For urgent or emergent issues, a  gastroenterologist can be reached at any hour by calling [phone]. Do not use MyChart messaging for urgent concerns.   DIET:  We do recommend a small meal at first, but then you may proceed to your regular diet.  Drink plenty of fluids but you should avoid alcoholic beverages for 24 hours.  ACTIVITY:  You should plan to take it easy for the rest of today and you should NOT DRIVE or use heavy machinery until tomorrow (because of the sedation medicines used during the test).    FOLLOW UP: Our staff will call the number listed on your records the next business day following your procedure.  We will call around 7:15- 8:00 am to check on you and address any questions or concerns that you may have regarding the information given to you following your procedure. If we do not reach you, we will leave a message.     SIGNATURES/CONFIDENTIALITY: You and/or your care partner have signed paperwork which will be entered into your electronic medical record.  These signatures attest to the fact that that the information above on your After Visit Summary has been reviewed and is understood.  Full responsibility of the confidentiality of this discharge information lies with you and/or your care-partner.

## 2023-04-01 NOTE — Progress Notes (Signed)
Uneventful anesthetic. Report to pacu rn. Vss. Care resumed by rn. 

## 2023-04-01 NOTE — Progress Notes (Signed)
[facility] History and Physical   Primary Care Physician:  Benita Stabile, MD   Reason for Procedure:   History of colon polyps  Plan:    colonoscopy     HPI: Edwin Nguyen is a 47 y.o. male  here for colonoscopy surveillance - 6 polyps removed 02/2020, brother had CRC age 58s, father had it age 40s.   Patient denies any bowel symptoms at this time. No family history of colon cancer known. Otherwise feels well without any cardiopulmonary symptoms.   I have discussed risks / benefits of anesthesia and endoscopic procedure with [name] and they wish to proceed with the exams as outlined today.    Past Medical History:  Diagnosis Date   Acute bronchitis    Allergy    seasonal   Asthma    Bronchitis    COVID-19 10/2019    Past Surgical History:  Procedure Laterality Date   COLONOSCOPY  02/2020   DENTAL SURGERY     LIPOMA EXCISION N/A 04/08/2020   Procedure: EXCISION LIPOMA, Right arm x2, Back x 3, Left thigh x 1;  Surgeon: Franky Macho, MD;  Location: [facility];  Service: General;  Laterality: N/A;    Prior to Admission medications   Medication Sig Start Date End Date Taking? Authorizing Provider  albuterol (PROVENTIL) (2.5 MG/3ML) 0.083% nebulizer solution Take 3 mLs (2.5 mg total) by nebulization every 6 (six) hours as needed for wheezing or shortness of breath. 01/13/19   Shane Crutch, MD  albuterol (VENTOLIN HFA) 108 (90 Base) MCG/ACT inhaler Inhale 2 puffs into the lungs every 6 (six) hours as needed for wheezing or shortness of breath. 06/25/19   Waldon Merl, PA-C  SPIRIVA RESPIMAT 1.25 MCG/ACT AERS Inhale 1 puff into the lungs daily.  01/24/20   [provider]    Current Outpatient Medications  Medication Sig Dispense Refill   albuterol (PROVENTIL) (2.5 MG/3ML) 0.083% nebulizer solution Take 3 mLs (2.5 mg total) by nebulization every 6 (six) hours as needed for wheezing or shortness of breath. 75 mL 12   albuterol (VENTOLIN  HFA) 108 (90 Base) MCG/ACT inhaler Inhale 2 puffs into the lungs every 6 (six) hours as needed for wheezing or shortness of breath. 16 g 0   SPIRIVA RESPIMAT 1.25 MCG/ACT AERS Inhale 1 puff into the lungs daily.      Current Facility-Administered Medications  Medication Dose Route Frequency Provider Last Rate Last Admin   0.9 %  sodium chloride infusion  500 mL Intravenous Once Karmello Abercrombie, Willaim Rayas, MD        Allergies as of 04/01/2023   (No Known Allergies)    Family History  Problem Relation Age of Onset   Prostate cancer Father    Colon cancer Father 68   Colon cancer Brother 45   Colon polyps Neg Hx    Esophageal cancer Neg Hx    Rectal cancer Neg Hx    Stomach cancer Neg Hx     Social History   Socioeconomic History   Marital status: Single    Spouse name: Not on file   Number of children: Not on file   Years of education: Not on file   Highest education level: Not on file  Occupational History   Not on file  Tobacco Use   Smoking status: Former    Current packs/day: 0.00    Average packs/day: 1 pack/day for 13.0 years (13.0 ttl pk-yrs)    Types: Cigarettes    Start  date: 03/10/2005    Quit date: 03/10/2018    Years since quitting: 5.0   Smokeless tobacco: Never   Tobacco comments:    quit 10 weeks ago  Vaping Use   Vaping status: Never Used  Substance and Sexual Activity   Alcohol use: Not Currently   Drug use: Not Currently   Sexual activity: Yes  Other Topics Concern   Not on file  Social History Narrative   Not on file   Social Determinants of Health   Financial Resource Strain: Not on file  Food Insecurity: Not on file  Transportation Needs: Not on file  Physical Activity: Not on file  Stress: Not on file  Social Connections: Not on file  Intimate Partner Violence: Not on file    Review of Systems: All other review of systems negative except as mentioned in the HPI.  Physical Exam: Vital signs BP 131/83   Pulse 63   Temp 99.6 F (37.6 C)  (Temporal)   Ht 6\' 1"  (1.854 m)   Wt 205 lb (93 kg)   SpO2 98%   BMI 27.05 kg/m   General:   Alert,  Well-developed, pleasant and cooperative in NAD Lungs:  Clear throughout to auscultation.   Heart:  Regular rate and rhythm Abdomen:  Soft, nontender and nondistended.   Neuro/Psych:  Alert and cooperative. Normal mood and affect. A and O x 3  Harlin Rain, MD [facility]

## 2023-04-01 NOTE — Op Note (Signed)
[facility] Patient Name: Edwin Nguyen Procedure Date: 04/01/2023 7:57 AM MRN: [id number] Endoscopist: Viviann Spare P. Adela Lank , MD, [id number] Age: 47 Referring MD:  Date of Birth: [birth year] Gender: Male Account #: [id number] Procedure:                Colonoscopy Indications:              High risk colon cancer surveillance: Personal                            history of colonic polyps - 6 polyps removed                            02/2020. brother had CRC age 70s, father had CRC age                            40s Medicines:                Monitored Anesthesia Care Procedure:                Pre-Anesthesia Assessment:                           - Prior to the procedure, a History and Physical                            was performed, and patient medications and                            allergies were reviewed. The patient's tolerance of                            previous anesthesia was also reviewed. The risks                            and benefits of the procedure and the sedation                            options and risks were discussed with the patient.                            All questions were answered, and informed consent                            was obtained. Prior Anticoagulants: The patient has                            taken no anticoagulant or antiplatelet agents. ASA                            Grade Assessment: II - A patient with mild systemic                            disease. After reviewing the risks and benefits,  the patient was deemed in satisfactory condition to                            undergo the procedure.                           After obtaining informed consent, the colonoscope                            was passed under direct vision. Throughout the                            procedure, the patient's blood pressure, pulse, and                            oxygen saturations were monitored continuously. The                             CF [id number] #[id number] was introduced through the anus                            and advanced to the the cecum, identified by                            appendiceal orifice and ileocecal valve. The                            colonoscopy was performed without difficulty. The                            patient tolerated the procedure well. The quality                            of the bowel preparation was good. The ileocecal                            valve, appendiceal orifice, and rectum were                            photographed. Scope In: 8:01:16 AM Scope Out: 8:13:29 AM Scope Withdrawal Time: 0 hours 10 minutes 15 seconds  Total Procedure Duration: 0 hours 12 minutes 13 seconds  Findings:                 The perianal and digital rectal examinations were                            normal.                           Scattered small-mouthed diverticula were found in                            the entire colon.  Internal hemorrhoids were found during                            retroflexion. The hemorrhoids were small.                           The exam was otherwise without abnormality. Complications:            No immediate complications. Estimated blood loss:                            None. Estimated Blood Loss:     Estimated blood loss: none. Impression:               - Diverticulosis in the entire examined colon.                           - Internal hemorrhoids.                           - The examination was otherwise normal.                           - No polyps.                           Fortunately no polyps since the last exam. Given                            history of polyps in early 57s, strong family                            history of colon cancer, would consider evaluation                            by genetic counselor if not yet done. Will discuss                            with the patient Recommendation:           -  Patient has a contact number available for                            emergencies. The signs and symptoms of potential                            delayed complications were discussed with the                            patient. Return to normal activities tomorrow.                            Written discharge instructions were provided to the                            patient.                           -  Resume previous diet.                           - Continue present medications.                           - Repeat colonoscopy in 5 years for surveillance.                           - Referral to genetic counselor if the patient                            wishes to proceed. Viviann Spare P. Mayumi Summerson, MD 04/01/2023 8:18:35 AM This report has been signed electronically.

## 2023-04-02 ENCOUNTER — Telehealth: Payer: Self-pay

## 2023-04-02 DIAGNOSIS — Z800 Family history of malignant neoplasm of digestive organs: Secondary | ICD-10-CM

## 2023-04-02 DIAGNOSIS — Z86010 Personal history of colonic polyps: Secondary | ICD-10-CM

## 2023-04-02 NOTE — Telephone Encounter (Signed)
-----   Message from Benancio Deeds sent at 04/01/2023  5:36 PM EDT ----- Regarding: referral Jan can you please refer this patient to genetic counselor - family history of colon cancer, history of colon polyps. Thanks!

## 2023-04-02 NOTE — Telephone Encounter (Signed)
Referral placed for [facility] for family history of colon cancer, history of colon polyps

## 2023-04-02 NOTE — Telephone Encounter (Signed)
Left message on answering machine. 

## 2023-04-03 ENCOUNTER — Telehealth: Payer: Self-pay | Admitting: Genetic Counselor

## 2023-04-03 NOTE — Telephone Encounter (Signed)
Called patient twice; left message regarding upcoming appointment to meet with the [facility]

## 2023-05-06 ENCOUNTER — Inpatient Hospital Stay: Payer: BC Managed Care – PPO

## 2023-05-06 ENCOUNTER — Inpatient Hospital Stay: Payer: BC Managed Care – PPO | Attending: Genetic Counselor | Admitting: Genetic Counselor

## 2023-05-06 NOTE — Progress Notes (Deleted)
REFERRING PROVIDER: ***  PRIMARY PROVIDER:  Benita Stabile, MD  PRIMARY REASON FOR VISIT:  No diagnosis found.  HISTORY OF PRESENT ILLNESS:   Edwin Nguyen, a 47 y.o. male, was seen for a [place] cancer genetics consultation at the request of Dr. Adela Lank due to a family history of colon cancer and a personal history of colon polyps.  Edwin Nguyen presents to clinic today to discuss the possibility of a hereditary predisposition to cancer, to discuss genetic testing, and to further clarify his future cancer risks, as well as potential cancer risks for family members.   Edwin Nguyen is a 47 y.o. male with no personal history of cancer.  He has a lifetime history of six tubular adenomas, detected by his 2021 colonoscopy.  No polyps were detected on his 2024 colonoscopy in 2024.  Repeat colonoscopy is planned in 5 years. In 2021, he had six lipomas removed (arm, back, and thighs).   CANCER HISTORY:  Oncology History   No history exists.     RISK FACTORS:  Colonoscopy: yes;  see history noted above . PSA screening: *** Dermatology screening: ***  Past Medical History:  Diagnosis Date   Acute bronchitis    Allergy    seasonal   Asthma    Bronchitis    COVID-19 10/2019    Past Surgical History:  Procedure Laterality Date   COLONOSCOPY  02/2020   DENTAL SURGERY     LIPOMA EXCISION N/A 04/08/2020   Procedure: EXCISION LIPOMA, Right arm x2, Back x 3, Left thigh x 1;  Surgeon: Franky Macho, MD;  Location: [facility];  Service: General;  Laterality: N/A;     FAMILY HISTORY:  We obtained a detailed, 4-generation family history.  Significant diagnoses are listed below: Family History  Problem Relation Age of Onset   Prostate cancer Father    Colon cancer Father 52   Colon cancer Brother 81   Colon polyps Neg Hx    Esophageal cancer Neg Hx    Rectal cancer Neg Hx    Stomach cancer Neg Hx     Edwin Nguyen is {aware/unaware} of previous family history of genetic testing for  hereditary cancer risks. Patient's maternal ancestors are of *** descent, and paternal ancestors are of *** descent. There {IS NO:12509} reported Ashkenazi Jewish ancestry. There {IS NO:12509} known consanguinity.  GENETIC COUNSELING ASSESSMENT: Edwin Nguyen is a 47 y.o. male with a {Personal/family:20331} history of {cancer/polyps} which is somewhat suggestive of a {DISEASE} and predisposition to cancer given ***. We, therefore, discussed and recommended the following at today's visit.   DISCUSSION: We discussed that *** - ***% of *** is hereditary, with most cases of hereditary *** cancer associated with ***.  There are other genes that can be associated with hereditary *** cancer syndromes.  These include ***.  We discussed that testing is beneficial for several reasons, including knowing about other cancer risks, identifying potential screening and risk-reduction options that may be appropriate, and to understanding if other family members could be at risk for cancer and allowing them to undergo genetic testing.  We reviewed the characteristics, features and inheritance patterns of hereditary cancer syndromes. We also discussed genetic testing, including the appropriate family members to test, the process of testing, insurance coverage and turn-around-time for results. We discussed the implications of a negative, positive, and variant of uncertain significant result. ***We discussed that negative results would be uninformative given that Mr. Zarrella does not have a personal history of cancer. We recommended Mr.  Nguyen pursue genetic testing for a panel that contains genes associated with ***.  Edwin Nguyen was offered a common hereditary cancer panel (48 genes) and an expanded pan-cancer panel (85 genes). Edwin Nguyen was informed of the benefits and limitations of each panel, including that expanded pan-cancer panels contain several genes that do not have clear management guidelines at this point in time.  We  also discussed that as the number of genes included on a panel increases, the chances of variants of uncertain significance increases.  After considering the benefits and limitations of each gene panel, Edwin Nguyen elected to have an *** through ***.   Based on Edwin Nguyen {Personal/family:20331} history of cancer, he meets medical criteria for genetic testing. Despite that he meets criteria, he may still have an out of pocket cost. We discussed that if his out of pocket cost for testing is over $100, the laboratory should contact them to discuss self-pay options and/or patient pay assistance programs.   ***We reviewed the characteristics, features and inheritance patterns of hereditary cancer syndromes. We also discussed genetic testing, including the appropriate family members to test, the process of testing, insurance coverage and turn-around-time for results. We discussed the implications of a negative, positive and/or variant of uncertain significant result. In order to get genetic test results in a timely manner so that [name] can use these genetic test results for surgical decisions, we recommended Edwin Nguyen pursue genetic testing for the ***. Once complete, we recommend Edwin Nguyen pursue reflex genetic testing to the *** gene panel.   Based on Edwin Nguyen {Personal/family:20331} history of cancer, he meets medical criteria for genetic testing. Despite that he meets criteria, he may still have an out of pocket cost.   ***We discussed with Edwin Nguyen that the {Personal/family:20331} history does not meet insurance or NCCN criteria for genetic testing and, therefore, is not highly consistent with a familial hereditary cancer syndrome.  We feel he is at low risk to harbor a gene mutation associated with such a condition. Thus, we did not recommend any genetic testing, at this time, and recommended Edwin Nguyen continue to follow the cancer screening guidelines given by his primary healthcare  provider.  ***In order to estimate his chance of having a {CA GENE:62345} mutation, we used statistical models ({GENMODELS:62370}) that consider his personal medical history, family history and ancestry.  Because each model is different, there can be a lot of variability in the risks they give.  Therefore, these numbers must be considered a rough range and not a precise risk of having a {CA GENE:62345} mutation.  These models estimate that she has approximately a ***-***% chance of having a mutation. Based on this assessment of her family and personal history, genetic testing {IS/ISNOT:34056} recommended.  ***Based on the patient's {Personal/family:20331} history, a statistical model ({GENMODELS:62370}) was used to estimate his risk of developing {CA HX:54794}. This estimates his lifetime risk of developing {CA HX:54794} to be approximately ***%. This estimation does not consider any genetic testing results.  The patient's lifetime breast cancer risk is a preliminary estimate based on available information using one of several models endorsed by the American Cancer Society (ACS). The ACS recommends consideration of breast MRI screening as an adjunct to mammography for patients at high risk (defined as 20% or greater lifetime risk).   ***Mr. Loretto has been determined to be at high risk for breast cancer.  Therefore, we recommend that annual screening with mammography and breast MRI be performed.  ***begin at  age 9, or 10 years prior to the age of breast cancer diagnosis in a relative (whichever is earlier).  We discussed that Mr. Gribbins should discuss her individual situation with her referring physician and determine a breast cancer screening plan with which they are both comfortable.    We discussed the Genetic Information Non-Discrimination Act (GINA) of 2008, which helps protect individuals against genetic discrimination based on their genetic test results.  It impacts both health insurance and  employment.  With health insurance, it protects against genetic test results being used for increased premiums or policy termination. For employment, it protects against hiring, firing and promoting decisions based on genetic test results.  GINA does not apply to those in the [employer], those who work for companies with less than 15 employees, and new life insurance or long-term disability insurance policies.  Health status due to a cancer diagnosis is not protected under GINA.  PLAN: After considering the risks, benefits, and limitations, Mr. Talamo provided informed consent to pursue genetic testing and the blood sample was sent to {Lab} Laboratories for analysis of the {test}. Results should be available within approximately {TAT TIME} weeks' time, at which point they will be disclosed by telephone to Mr. Degrande, as will any additional recommendations warranted by these results. Mr. Kershner will receive a summary of his genetic counseling visit and a copy of his results once available. This information will also be available in Epic.   *** Despite our recommendation, Mr. Aldridge did not wish to pursue genetic testing at today's visit. We understand this decision and remain available to coordinate genetic testing at any time in the future. We, therefore, recommend Mr. Glynn continue to follow the cancer screening guidelines given by his primary healthcare provider.  ***Based on Mr. Trosclair family history, we recommended his ***, who was diagnosed with *** at age ***, have genetic counseling and testing. Mr. Helmick will let us know if we can be of any assistance in coordinating genetic counseling and/or testing for this family member.   Lastly, we encouraged Mr. Santell to remain in contact with cancer genetics annually so that we can continuously update the family history and inform him of any changes in cancer genetics and testing that may be of benefit for this family.   Mr. Vonada questions were  answered to his satisfaction today. Our contact information was provided should additional questions or concerns arise. ***Thank you for the referral and allowing [place] to share in the care of your patient.   Synia Douglass M. Rennie Plowman, MS, [facility] Genetic Counselor [email or web address] (P) [phone]   The patient was seen for a total of *** minutes in face-to-face genetic counseling.  ***The was patient was accompanied by ***.  ***The patient was seen alone.  Drs. Pamelia Hoit and/or Mosetta Putt were available to discuss this case as needed.  _______________________________________________________________________ For Office Staff:  Number of people involved in session: *** Was an Intern/ student involved with case: {YES/NO:63}

## 2023-05-07 DIAGNOSIS — Z136 Encounter for screening for cardiovascular disorders: Secondary | ICD-10-CM | POA: Diagnosis not present

## 2023-05-07 DIAGNOSIS — R7301 Impaired fasting glucose: Secondary | ICD-10-CM | POA: Diagnosis not present

## 2023-05-07 DIAGNOSIS — Z139 Encounter for screening, unspecified: Secondary | ICD-10-CM | POA: Diagnosis not present

## 2023-05-14 ENCOUNTER — Other Ambulatory Visit (HOSPITAL_COMMUNITY): Payer: Self-pay | Admitting: Family Medicine

## 2023-05-14 DIAGNOSIS — M79672 Pain in left foot: Secondary | ICD-10-CM | POA: Diagnosis not present

## 2023-05-14 DIAGNOSIS — B353 Tinea pedis: Secondary | ICD-10-CM | POA: Diagnosis not present

## 2023-05-14 DIAGNOSIS — B369 Superficial mycosis, unspecified: Secondary | ICD-10-CM | POA: Diagnosis not present

## 2023-05-14 DIAGNOSIS — E785 Hyperlipidemia, unspecified: Secondary | ICD-10-CM

## 2023-05-14 DIAGNOSIS — D171 Benign lipomatous neoplasm of skin and subcutaneous tissue of trunk: Secondary | ICD-10-CM | POA: Diagnosis not present

## 2023-05-14 DIAGNOSIS — J4540 Moderate persistent asthma, uncomplicated: Secondary | ICD-10-CM | POA: Diagnosis not present

## 2023-05-14 DIAGNOSIS — J302 Other seasonal allergic rhinitis: Secondary | ICD-10-CM | POA: Diagnosis not present

## 2023-05-14 DIAGNOSIS — G2581 Restless legs syndrome: Secondary | ICD-10-CM | POA: Diagnosis not present

## 2023-05-27 ENCOUNTER — Encounter: Payer: Self-pay | Admitting: *Deleted

## 2023-06-18 ENCOUNTER — Ambulatory Visit (HOSPITAL_COMMUNITY): Payer: BC Managed Care – PPO

## 2023-06-22 DIAGNOSIS — T1511XA Foreign body in conjunctival sac, right eye, initial encounter: Secondary | ICD-10-CM | POA: Diagnosis not present

## 2023-06-22 DIAGNOSIS — H16141 Punctate keratitis, right eye: Secondary | ICD-10-CM | POA: Diagnosis not present

## 2023-07-09 ENCOUNTER — Ambulatory Visit: Payer: BC Managed Care – PPO | Admitting: General Surgery

## 2023-07-15 ENCOUNTER — Other Ambulatory Visit: Payer: Self-pay | Admitting: *Deleted

## 2023-07-15 DIAGNOSIS — D179 Benign lipomatous neoplasm, unspecified: Secondary | ICD-10-CM

## 2023-07-16 ENCOUNTER — Ambulatory Visit: Payer: BC Managed Care – PPO | Admitting: General Surgery

## 2023-07-16 ENCOUNTER — Ambulatory Visit (HOSPITAL_COMMUNITY)
Admission: RE | Admit: 2023-07-16 | Discharge: 2023-07-16 | Disposition: A | Discharge status: Home / Self Care | Payer: BC Managed Care – PPO | Source: Ambulatory Visit | Attending: Family Medicine | Admitting: Family Medicine

## 2023-07-16 DIAGNOSIS — E785 Hyperlipidemia, unspecified: Secondary | ICD-10-CM | POA: Insufficient documentation

## 2023-08-13 ENCOUNTER — Ambulatory Visit: Payer: BC Managed Care – PPO | Admitting: General Surgery

## 2023-12-20 DIAGNOSIS — E559 Vitamin D deficiency, unspecified: Secondary | ICD-10-CM | POA: Diagnosis not present

## 2024-01-01 DIAGNOSIS — J302 Other seasonal allergic rhinitis: Secondary | ICD-10-CM | POA: Diagnosis not present

## 2024-01-01 DIAGNOSIS — Z0001 Encounter for general adult medical examination with abnormal findings: Secondary | ICD-10-CM | POA: Diagnosis not present

## 2024-01-01 DIAGNOSIS — J4540 Moderate persistent asthma, uncomplicated: Secondary | ICD-10-CM | POA: Diagnosis not present

## 2024-01-01 DIAGNOSIS — G2581 Restless legs syndrome: Secondary | ICD-10-CM | POA: Diagnosis not present

## 2024-01-01 DIAGNOSIS — B369 Superficial mycosis, unspecified: Secondary | ICD-10-CM | POA: Diagnosis not present

## 2024-02-13 ENCOUNTER — Ambulatory Visit: Admitting: General Surgery

## 2024-03-10 ENCOUNTER — Ambulatory Visit: Admitting: General Surgery

## 2024-03-10 ENCOUNTER — Encounter: Payer: Self-pay | Admitting: General Surgery

## 2024-03-10 VITALS — BP 117/77 | HR 81 | Temp 98.1°F | Resp 12 | Ht 73.0 in | Wt 199.0 lb

## 2024-03-10 DIAGNOSIS — D179 Benign lipomatous neoplasm, unspecified: Secondary | ICD-10-CM | POA: Diagnosis not present

## 2024-03-11 NOTE — Progress Notes (Signed)
 Edwin Nguyen; [id number]; July 09, 1976   HPI Patient is a 48 year old white male who returns to my care for removal of multiple lipomas.  He last had lipomas removed to in 2021.  He has developed more lipomas that are starting to enlarge and are bothersome. Past Medical History:  Diagnosis Date   Acute bronchitis    Allergy    seasonal   Asthma    Bronchitis    COVID-19 10/2019    Past Surgical History:  Procedure Laterality Date   COLONOSCOPY  02/2020   DENTAL SURGERY     LIPOMA EXCISION N/A 04/08/2020   Procedure: EXCISION LIPOMA, Right arm x2, Back x 3, Left thigh x 1;  Surgeon: Mavis Anes, MD;  Location: [facility];  Service: General;  Laterality: N/A;    Family History  Problem Relation Age of Onset   Prostate cancer Father    Colon cancer Father 76   Colon cancer Brother 81   Colon polyps Neg Hx    Esophageal cancer Neg Hx    Rectal cancer Neg Hx    Stomach cancer Neg Hx     Current Outpatient Medications on File Prior to Visit  Medication Sig Dispense Refill   albuterol  (PROVENTIL ) (2.5 MG/3ML) 0.083% nebulizer solution Take 3 mLs (2.5 mg total) by nebulization every 6 (six) hours as needed for wheezing or shortness of breath. 75 mL 12   albuterol  (VENTOLIN  HFA) 108 (90 Base) MCG/ACT inhaler Inhale 2 puffs into the lungs every 6 (six) hours as needed for wheezing or shortness of breath. 16 g 0   SPIRIVA RESPIMAT 1.25 MCG/ACT AERS Inhale 1 puff into the lungs daily.      No current facility-administered medications on file prior to visit.    No Known Allergies  Social History   Substance and Sexual Activity  Alcohol Use Not Currently    Social History   Tobacco Use  Smoking Status Former   Current packs/day: 0.00   Average packs/day: 1 pack/day for 13.0 years (13.0 ttl pk-yrs)   Types: Cigarettes   Start date: 03/10/2005   Quit date: 03/10/2018   Years since quitting: 6.0  Smokeless Tobacco Never  Tobacco Comments   quit 10 weeks ago    Review of  Systems  Constitutional: Negative.   HENT: Negative.    Eyes: Negative.   Respiratory: Negative.    Gastrointestinal: Negative.   Genitourinary: Negative.   Musculoskeletal: Negative.   Skin: Negative.   Neurological: Negative.   Endo/Heme/Allergies: Negative.   Psychiatric/Behavioral: Negative.      Objective   Vitals:   03/10/24 1500  BP: 117/77  Pulse: 81  Resp: 12  Temp: 98.1 F (36.7 C)  SpO2: 95%    Physical Exam Vitals reviewed.  Constitutional:      Appearance: Normal appearance. He is normal weight. He is not ill-appearing.  HENT:     Head: Normocephalic and atraumatic.  Cardiovascular:     Rate and Rhythm: Normal rate and regular rhythm.     Heart sounds: Normal heart sounds. No murmur heard.    No friction rub. No gallop.  Pulmonary:     Effort: Pulmonary effort is normal. No respiratory distress.     Breath sounds: Normal breath sounds. No stridor. No wheezing, rhonchi or rales.  Skin:    General: Skin is warm and dry.     Comments: Patient has a 1.5 cm sebaceous cyst on the posterior scalp, 3 abdominal wall lipomas measuring a total of 6 cm,  a right costal 4 cm lipoma, left forearm and left arm lipomas totaling 6 cm, a right forearm lipoma x 2 measuring 5 cm.  Neurological:     Mental Status: He is alert and oriented to person, place, and time.     Assessment  Multiple lipomas of abdominal wall, left forearm, left arm, right forearm, and chest wall Sebaceous cyst of scalp Plan  Patient is scheduled for excision of the multiple lipomas and the sebaceous cyst on his scalp on 04/15/2024.  The risks and benefits of the procedure including bleeding, infection, and recurrence of the lipomas were fully explained to the patient, who gave informed consent.

## 2024-03-11 NOTE — Addendum Note (Signed)
 Addended by: SAUNDRA TAWNI DEL on: 03/11/2024 09:20 AM   Modules accepted: Orders

## 2024-03-23 NOTE — H&P (Signed)
 Edwin Nguyen; [id number]; July 09, 1976   HPI Patient is a 48 year old white male who returns to my care for removal of multiple lipomas.  He last had lipomas removed to in 2021.  He has developed more lipomas that are starting to enlarge and are bothersome. Past Medical History:  Diagnosis Date   Acute bronchitis    Allergy    seasonal   Asthma    Bronchitis    COVID-19 10/2019    Past Surgical History:  Procedure Laterality Date   COLONOSCOPY  02/2020   DENTAL SURGERY     LIPOMA EXCISION N/A 04/08/2020   Procedure: EXCISION LIPOMA, Right arm x2, Back x 3, Left thigh x 1;  Surgeon: Mavis Anes, MD;  Location: [facility];  Service: General;  Laterality: N/A;    Family History  Problem Relation Age of Onset   Prostate cancer Father    Colon cancer Father 76   Colon cancer Brother 81   Colon polyps Neg Hx    Esophageal cancer Neg Hx    Rectal cancer Neg Hx    Stomach cancer Neg Hx     Current Outpatient Medications on File Prior to Visit  Medication Sig Dispense Refill   albuterol  (PROVENTIL ) (2.5 MG/3ML) 0.083% nebulizer solution Take 3 mLs (2.5 mg total) by nebulization every 6 (six) hours as needed for wheezing or shortness of breath. 75 mL 12   albuterol  (VENTOLIN  HFA) 108 (90 Base) MCG/ACT inhaler Inhale 2 puffs into the lungs every 6 (six) hours as needed for wheezing or shortness of breath. 16 g 0   SPIRIVA RESPIMAT 1.25 MCG/ACT AERS Inhale 1 puff into the lungs daily.      No current facility-administered medications on file prior to visit.    No Known Allergies  Social History   Substance and Sexual Activity  Alcohol Use Not Currently    Social History   Tobacco Use  Smoking Status Former   Current packs/day: 0.00   Average packs/day: 1 pack/day for 13.0 years (13.0 ttl pk-yrs)   Types: Cigarettes   Start date: 03/10/2005   Quit date: 03/10/2018   Years since quitting: 6.0  Smokeless Tobacco Never  Tobacco Comments   quit 10 weeks ago    Review of  Systems  Constitutional: Negative.   HENT: Negative.    Eyes: Negative.   Respiratory: Negative.    Gastrointestinal: Negative.   Genitourinary: Negative.   Musculoskeletal: Negative.   Skin: Negative.   Neurological: Negative.   Endo/Heme/Allergies: Negative.   Psychiatric/Behavioral: Negative.      Objective   Vitals:   03/10/24 1500  BP: 117/77  Pulse: 81  Resp: 12  Temp: 98.1 F (36.7 C)  SpO2: 95%    Physical Exam Vitals reviewed.  Constitutional:      Appearance: Normal appearance. He is normal weight. He is not ill-appearing.  HENT:     Head: Normocephalic and atraumatic.  Cardiovascular:     Rate and Rhythm: Normal rate and regular rhythm.     Heart sounds: Normal heart sounds. No murmur heard.    No friction rub. No gallop.  Pulmonary:     Effort: Pulmonary effort is normal. No respiratory distress.     Breath sounds: Normal breath sounds. No stridor. No wheezing, rhonchi or rales.  Skin:    General: Skin is warm and dry.     Comments: Patient has a 1.5 cm sebaceous cyst on the posterior scalp, 3 abdominal wall lipomas measuring a total of 6 cm,  a right costal 4 cm lipoma, left forearm and left arm lipomas totaling 6 cm, a right forearm lipoma x 2 measuring 5 cm.  Neurological:     Mental Status: He is alert and oriented to person, place, and time.     Assessment  Multiple lipomas of abdominal wall, left forearm, left arm, right forearm, and chest wall Sebaceous cyst of scalp Plan  Patient is scheduled for excision of the multiple lipomas and the sebaceous cyst on his scalp on 04/15/2024.  The risks and benefits of the procedure including bleeding, infection, and recurrence of the lipomas were fully explained to the patient, who gave informed consent.

## 2024-04-13 ENCOUNTER — Encounter (HOSPITAL_COMMUNITY)
Admission: RE | Admit: 2024-04-13 | Discharge: 2024-04-13 | Disposition: A | Discharge status: Home / Self Care | Source: Ambulatory Visit | Attending: General Surgery | Admitting: General Surgery

## 2024-04-15 ENCOUNTER — Ambulatory Visit (HOSPITAL_COMMUNITY)

## 2024-04-15 ENCOUNTER — Encounter (HOSPITAL_COMMUNITY): Payer: Self-pay | Admitting: General Surgery

## 2024-04-15 ENCOUNTER — Ambulatory Visit (HOSPITAL_COMMUNITY)
Admission: RE | Admit: 2024-04-15 | Discharge: 2024-04-15 | Disposition: A | Discharge status: Home / Self Care | Attending: General Surgery | Admitting: General Surgery

## 2024-04-15 ENCOUNTER — Encounter (HOSPITAL_COMMUNITY)
Admission: RE | Disposition: A | Discharge status: Home / Self Care | Payer: Self-pay | Source: Home / Self Care | Attending: General Surgery

## 2024-04-15 ENCOUNTER — Other Ambulatory Visit: Payer: Self-pay

## 2024-04-15 DIAGNOSIS — Z87891 Personal history of nicotine dependence: Secondary | ICD-10-CM | POA: Diagnosis not present

## 2024-04-15 DIAGNOSIS — L720 Epidermal cyst: Secondary | ICD-10-CM

## 2024-04-15 DIAGNOSIS — L7211 Pilar cyst: Secondary | ICD-10-CM | POA: Diagnosis not present

## 2024-04-15 DIAGNOSIS — D1722 Benign lipomatous neoplasm of skin and subcutaneous tissue of left arm: Secondary | ICD-10-CM | POA: Insufficient documentation

## 2024-04-15 DIAGNOSIS — D171 Benign lipomatous neoplasm of skin and subcutaneous tissue of trunk: Secondary | ICD-10-CM | POA: Insufficient documentation

## 2024-04-15 DIAGNOSIS — L723 Sebaceous cyst: Secondary | ICD-10-CM | POA: Diagnosis not present

## 2024-04-15 DIAGNOSIS — D1721 Benign lipomatous neoplasm of skin and subcutaneous tissue of right arm: Secondary | ICD-10-CM | POA: Diagnosis not present

## 2024-04-15 DIAGNOSIS — J45909 Unspecified asthma, uncomplicated: Secondary | ICD-10-CM | POA: Diagnosis not present

## 2024-04-15 HISTORY — PX: EXCISION MASS HEAD: SHX6702

## 2024-04-15 HISTORY — PX: EXCISION OF ABDOMINAL WALL TUMOR: SHX6687

## 2024-04-15 HISTORY — PX: EXCISION MASS UPPER EXTREMETIES: SHX6704

## 2024-04-15 SURGERY — EXCISION, MASS, HEAD
Anesthesia: General | Site: Scalp

## 2024-04-15 MED ORDER — BUPIVACAINE HCL (PF) 0.5 % IJ SOLN
INTRAMUSCULAR | Status: DC | PRN
  Administered 2024-04-15: 1 mL
  Administered 2024-04-15: 7 mL
  Administered 2024-04-15: 3 mL
  Administered 2024-04-15: 11 mL

## 2024-04-15 MED ORDER — LIDOCAINE 2% (20 MG/ML) 5 ML SYRINGE
INTRAMUSCULAR | Status: AC
  Filled 2024-04-15: qty 5

## 2024-04-15 MED ORDER — CHLORHEXIDINE GLUCONATE CLOTH 2 % EX PADS: 6.0000 | MEDICATED_PAD | Freq: Once | CUTANEOUS | Status: DC

## 2024-04-15 MED ORDER — SUGAMMADEX SODIUM 200 MG/2ML IV SOLN
INTRAVENOUS | Status: DC | PRN
  Administered 2024-04-15: 200 mg via INTRAVENOUS

## 2024-04-15 MED ORDER — 0.9 % SODIUM CHLORIDE (POUR BTL) OPTIME
TOPICAL | Status: DC | PRN
  Administered 2024-04-15: 1000 mL

## 2024-04-15 MED ORDER — MIDAZOLAM HCL 2 MG/2ML IJ SOLN
INTRAMUSCULAR | Status: DC | PRN
  Administered 2024-04-15: 2 mg via INTRAVENOUS

## 2024-04-15 MED ORDER — HYDROCODONE-ACETAMINOPHEN 7.5-325 MG PO TABS: 1.0000 | ORAL_TABLET | Freq: Once | ORAL | Status: DC | PRN

## 2024-04-15 MED ORDER — HYDROCODONE-ACETAMINOPHEN 5-325 MG PO TABS: 1.0000 | ORAL_TABLET | ORAL | 0 refills | Status: AC | PRN

## 2024-04-15 MED ORDER — ORAL CARE MOUTH RINSE: 15.0000 mL | Freq: Once | OROMUCOSAL | Status: AC

## 2024-04-15 MED ORDER — FENTANYL CITRATE (PF) 100 MCG/2ML IJ SOLN
INTRAMUSCULAR | Status: AC
  Filled 2024-04-15: qty 2

## 2024-04-15 MED ORDER — FENTANYL CITRATE (PF) 100 MCG/2ML IJ SOLN
INTRAMUSCULAR | Status: AC
Start: 2024-04-15 — End: 2024-04-15
  Filled 2024-04-15: qty 2

## 2024-04-15 MED ORDER — KETOROLAC TROMETHAMINE 30 MG/ML IJ SOLN: 30.0000 mg | Freq: Once | INTRAMUSCULAR | Status: DC

## 2024-04-15 MED ORDER — MIDAZOLAM HCL 2 MG/2ML IJ SOLN
INTRAMUSCULAR | Status: AC
  Filled 2024-04-15: qty 2

## 2024-04-15 MED ORDER — CEFAZOLIN SODIUM-DEXTROSE 2-4 GM/100ML-% IV SOLN
2.0000 g | INTRAVENOUS | Status: AC
  Administered 2024-04-15: 2 g via INTRAVENOUS
  Filled 2024-04-15: qty 100

## 2024-04-15 MED ORDER — LACTATED RINGERS IV SOLN: INTRAVENOUS | Status: DC

## 2024-04-15 MED ORDER — FENTANYL CITRATE PF 50 MCG/ML IJ SOSY: 25.0000 ug | PREFILLED_SYRINGE | INTRAMUSCULAR | Status: DC | PRN

## 2024-04-15 MED ORDER — FENTANYL CITRATE (PF) 100 MCG/2ML IJ SOLN
INTRAMUSCULAR | Status: DC | PRN
  Administered 2024-04-15: 100 ug via INTRAVENOUS
  Administered 2024-04-15 (×2): 50 ug via INTRAVENOUS

## 2024-04-15 MED ORDER — PROPOFOL 10 MG/ML IV BOLUS
INTRAVENOUS | Status: DC | PRN
  Administered 2024-04-15: 300 mg via INTRAVENOUS

## 2024-04-15 MED ORDER — ONDANSETRON HCL 4 MG/2ML IJ SOLN
INTRAMUSCULAR | Status: DC | PRN
  Administered 2024-04-15: 4 mg via INTRAVENOUS

## 2024-04-15 MED ORDER — ROCURONIUM BROMIDE 10 MG/ML (PF) SYRINGE
PREFILLED_SYRINGE | INTRAVENOUS | Status: AC
  Filled 2024-04-15: qty 10

## 2024-04-15 MED ORDER — DEXAMETHASONE SODIUM PHOSPHATE 10 MG/ML IJ SOLN
INTRAMUSCULAR | Status: DC | PRN
Start: 2024-04-15 — End: 2024-04-15
  Administered 2024-04-15: 8 mg via INTRAVENOUS

## 2024-04-15 MED ORDER — BUPIVACAINE HCL (PF) 0.5 % IJ SOLN
INTRAMUSCULAR | Status: AC
  Filled 2024-04-15: qty 30

## 2024-04-15 MED ORDER — LIDOCAINE 2% (20 MG/ML) 5 ML SYRINGE
INTRAMUSCULAR | Status: DC | PRN
  Administered 2024-04-15: 60 mg via INTRAVENOUS

## 2024-04-15 MED ORDER — SUGAMMADEX SODIUM 200 MG/2ML IV SOLN
INTRAVENOUS | Status: AC
Start: 2024-04-15 — End: 2024-04-15
  Filled 2024-04-15: qty 2

## 2024-04-15 MED ORDER — PROPOFOL 10 MG/ML IV BOLUS
INTRAVENOUS | Status: AC
  Filled 2024-04-15: qty 20

## 2024-04-15 MED ORDER — CHLORHEXIDINE GLUCONATE 0.12 % MT SOLN
15.0000 mL | Freq: Once | OROMUCOSAL | Status: AC
  Administered 2024-04-15: 15 mL via OROMUCOSAL

## 2024-04-15 MED ORDER — ROCURONIUM BROMIDE 10 MG/ML (PF) SYRINGE
PREFILLED_SYRINGE | INTRAVENOUS | Status: DC | PRN
  Administered 2024-04-15: 60 mg via INTRAVENOUS

## 2024-04-15 SURGICAL SUPPLY — 26 items
BLADE SURG SZ11 CARB STEEL (BLADE) ×1 IMPLANT
CLOTH BEACON ORANGE TIMEOUT ST (SAFETY) ×4 IMPLANT
COVER LIGHT HANDLE (MISCELLANEOUS) ×2 IMPLANT
DERMABOND ADVANCED .7 DNX12 (GAUZE/BANDAGES/DRESSINGS) ×4 IMPLANT
DRAPE EENT ADH APERT 31X51 STR (DRAPES) ×2 IMPLANT
DRAPE HALF SHEET 40X57 (DRAPES) ×1 IMPLANT
ELECT NDL TIP 2.8 STRL (NEEDLE) IMPLANT
ELECT NEEDLE TIP 2.8 STRL (NEEDLE) ×4 IMPLANT
ELECTRODE REM PT RTRN 9FT ADLT (ELECTROSURGICAL) ×4 IMPLANT
GAUZE SPONGE 4X4 12PLY STRL (GAUZE/BANDAGES/DRESSINGS) ×1 IMPLANT
GLOVE BIOGEL PI IND STRL 7.0 (GLOVE) ×9 IMPLANT
GLOVE SURG SS PI 7.5 STRL IVOR (GLOVE) ×7 IMPLANT
GOWN STRL REUS W/TWL LRG LVL3 (GOWN DISPOSABLE) ×8 IMPLANT
KIT TURNOVER KIT A (KITS) ×4 IMPLANT
NDL HYPO 21X1.5 SAFETY (NEEDLE) IMPLANT
NDL HYPO 25X1 1.5 SAFETY (NEEDLE) ×3 IMPLANT
NEEDLE HYPO 21X1.5 SAFETY (NEEDLE) ×4 IMPLANT
NEEDLE HYPO 25X1 1.5 SAFETY (NEEDLE) ×4 IMPLANT
NS IRRIG 1000ML POUR BTL (IV SOLUTION) ×4 IMPLANT
PACK MINOR (CUSTOM PROCEDURE TRAY) ×4 IMPLANT
PAD ARMBOARD POSITIONER FOAM (MISCELLANEOUS) ×4 IMPLANT
POSITIONER HEAD 8X9X4 ADT (SOFTGOODS) ×4 IMPLANT
SET BASIN LINEN APH (SET/KITS/TRAYS/PACK) ×4 IMPLANT
SUT MNCRL AB 4-0 PS2 18 (SUTURE) ×6 IMPLANT
SYR CONTROL 10ML LL (SYRINGE) ×5 IMPLANT
TOWEL OR 17X26 4PK STRL BLUE (TOWEL DISPOSABLE) ×1 IMPLANT

## 2024-04-15 NOTE — Discharge Instructions (Signed)

## 2024-04-15 NOTE — Addendum Note (Signed)
 Addendum  created 04/15/24 1124 by Para Jerelene CROME, CRNA   Flowsheet accepted, Intraprocedure Flowsheets edited

## 2024-04-15 NOTE — Interval H&P Note (Signed)
 History and Physical Interval Note:  04/15/2024 8:52 AM  Edwin Nguyen Lot  has presented today for surgery, with the diagnosis of MULTIPLE LIPOMAS.  The various methods of treatment have been discussed with the patient and family. After consideration of risks, benefits and other options for treatment, the patient has consented to  Procedure(s) with comments: EXCISION, MASS, HEAD (N/A) - LIPOMA, SCALP 2CM EXCISION, NEOPLASM, ABDOMINAL WALL (Bilateral) - LIPOMA, LEFT AND RIGHT- 9CM EXCISION MASS UPPER EXTREMITIES (Bilateral) - LEFT FOREARM 4 CM RIGHT FOREARM 3.5 CM EXCISION, MASS, UPPER EXTREMITY (Left) - 4 CM as a surgical intervention.  The patient's history has been reviewed, patient examined, no change in status, stable for surgery.  I have reviewed the patient's chart and labs.  Questions were answered to the patient's satisfaction.     [name]

## 2024-04-15 NOTE — Transfer of Care (Signed)
 Immediate Anesthesia Transfer of Care Note  Patient: CODYLEE PATIL  Procedure(s) Performed: EXCISION, MASS, HEAD (Scalp) EXCISION, NEOPLASM, ABDOMINAL WALL (Bilateral: Abdomen) EXCISION MASS UPPER EXTREMITIES (Bilateral: Arm Lower)  Patient Location: PACU  Anesthesia Type:General  Level of Consciousness: awake and patient cooperative  Airway & Oxygen Therapy: Patient Spontanous Breathing and Patient connected to nasal cannula oxygen  Post-op Assessment: Report given to RN and Post -op Vital signs reviewed and stable  Post vital signs: Reviewed and stable  Last Vitals:  Vitals Value Taken Time  BP 128/83 04/15/24 10:49  Temp 36.4 C 04/15/24 10:49  Pulse 57 04/15/24 10:57  Resp 9 04/15/24 10:57  SpO2 100 % 04/15/24 10:57  Vitals shown include unfiled device data.  Last Pain:  Vitals:   04/15/24 0723  TempSrc: Oral  PainSc: 0-No pain         Complications: No notable events documented.

## 2024-04-15 NOTE — Anesthesia Postprocedure Evaluation (Signed)
 Anesthesia Post Note  Patient: Edwin Nguyen  Procedure(s) Performed: EXCISION, MASS, HEAD (Scalp) EXCISION, NEOPLASM, ABDOMINAL WALL (Bilateral: Abdomen) EXCISION MASS UPPER EXTREMITIES (Bilateral: Arm Lower)  Patient location during evaluation: PACU Anesthesia Type: General Level of consciousness: awake and alert Pain management: pain level controlled Vital Signs Assessment: post-procedure vital signs reviewed and stable Respiratory status: spontaneous breathing, nonlabored ventilation, respiratory function stable and patient connected to nasal cannula oxygen Cardiovascular status: blood pressure returned to baseline and stable Postop Assessment: no apparent nausea or vomiting Anesthetic complications: no   No notable events documented.   Last Vitals:  Vitals:   04/15/24 0723 04/15/24 1049  BP: 137/81 128/83  Pulse: 67 67  Resp: 13 (!) 9  Temp: 36.5 C (!) 36.4 C  SpO2: 100% 100%    Last Pain:  Vitals:   04/15/24 1049  TempSrc:   PainSc: 0-No pain                 Andrea Limes

## 2024-04-15 NOTE — Op Note (Signed)
 Patient:  Edwin Nguyen  DOB:  Feb 27, 1976  MRN:  [id number]   Preop Diagnosis: Sebaceous cyst, scalp  Multiple lipomas, left arm, right arm, torso  Postop Diagnosis: Same  Procedure: Excision of inclusion cyst, scalp  Excision of multiple lipomas, left arm, right arm, and torso  Surgeon: [name], MD  Anes: General  Indications: Patient is a 48 year old white male who has both a sebaceous cyst on his scalp as well as multiple lipomas in the upper extremities and torso.  The risks and benefits of the procedure including bleeding, infection, and recurrence of the lipomas were fully explained to the patient, who gave informed consent.  Procedure note: The patient initially was placed in the right lateral decubitus position after general anesthesia was administered.  The posterior scalp was prepped and draped using the usual sterile technique with Betadine.  Surgical site confirmation was performed.  An incision was made over the sebaceous cyst which measured almost 1 cm in its greatest diameter.  It was excised in total without difficulty.  It was sent to pathology further examination.  The bleeding was controlled using Bovie electrocautery.  0.5% Sensorcaine  was instilled into the surrounding wound.  The skin was closed using a 4-0 Monocryl subcuticular suture.  Dermabond was applied.  The patient was returned back to the supine position.  The left arm, torso, and right arm were all prepped and draped using the usual sterile technique with Betadine.  Surgical site confirmation was performed.  I first turned my attention to the left upper extremity.  There were 2 lipomas in the left arm and 1 on the forearm.  Incisions were made over all of these lipomas and they were excised without difficulty.  Total length of the incisions was 4 cm.  They were sent to pathology for further examination.  A bleeding was controlled using Bovie electrocautery.  0.5% Sensorcaine  was instilled into the  surrounding wound.  All 3 incisions were closed using a 4-0 Monocryl subcuticular suture.  Dermabond was applied.  Next, the torso lipomas were excised.  There was 1 in the right lower chest wall and 2 along the left lower abdominal wall.  Total measurement was 5 cm.  All were removed in total without difficulty.  They were sent to pathology further examination.  A bleeding was controlled using Bovie electrocautery.  0.5% Sensorcaine  was instilled into the surrounding wounds.  All 3 incisions were closed using a 4-0 Monocryl subcuticular suture.  Dermabond was applied.  Next, a right forearm 1 cm lipoma was excised without difficulty.  It was sent to pathology for further examination.  A bleeding was controlled using Bovie electrocautery.  0.5% Sensorcaine  was instilled into the surrounding wound.  The skin was closed using a 4-0 Monocryl subcuticular suture.  Dermabond was applied.  All tape and needle counts were correct at the end of the procedures.  The patient was awakened and sent to the PACU in stable condition.  Complications: None  EBL: Minimal  Specimen: Inclusion cyst, scalp Lipomas x 3, left arm Lipomas x 3, torso Lipoma x 1, right forearm

## 2024-04-15 NOTE — Anesthesia Preprocedure Evaluation (Addendum)
 Anesthesia Evaluation  Patient identified by MRN, date of birth, ID band Patient awake    Reviewed: Allergy & Precautions, H&P , NPO status , Patient's Chart, lab work & pertinent test results  Airway Mallampati: II  TM Distance: >3 FB Neck ROM: Full    Dental no notable dental hx.    Pulmonary asthma , pneumonia, former smoker   Pulmonary exam normal breath sounds clear to auscultation       Cardiovascular negative cardio ROS Normal cardiovascular exam Rhythm:Regular Rate:Normal     Neuro/Psych negative neurological ROS  negative psych ROS   GI/Hepatic negative GI ROS, Neg liver ROS,,,  Endo/Other  negative endocrine ROS    Renal/GU negative Renal ROS  negative genitourinary   Musculoskeletal negative musculoskeletal ROS (+)    Abdominal   Peds negative pediatric ROS (+)  Hematology negative hematology ROS (+)   Anesthesia Other Findings   Reproductive/Obstetrics negative OB ROS                              Anesthesia Physical Anesthesia Plan  ASA: 2  Anesthesia Plan: General   Post-op Pain Management:    Induction: Intravenous  PONV Risk Score and Plan:   Airway Management Planned: Oral ETT  Additional Equipment:   Intra-op Plan:   Post-operative Plan: Extubation in OR  Informed Consent: I have reviewed the patients History and Physical, chart, labs and discussed the procedure including the risks, benefits and alternatives for the proposed anesthesia with the patient or authorized representative who has indicated his/her understanding and acceptance.     Dental advisory given  Plan Discussed with: CRNA  Anesthesia Plan Comments:         Anesthesia Quick Evaluation

## 2024-04-15 NOTE — Anesthesia Procedure Notes (Addendum)
 Procedure Name: Intubation Date/Time: 04/15/2024 9:25 AM  Performed by: Para Jerelene CROME, CRNAPre-anesthesia Checklist: Patient identified, Emergency Drugs available, Suction available and Patient being monitored Patient Re-evaluated:Patient Re-evaluated prior to induction Oxygen Delivery Method: Circle system utilized Preoxygenation: Pre-oxygenation with 100% oxygen Induction Type: IV induction Ventilation: Mask ventilation without difficulty Laryngoscope Size: Glidescope and 3 Grade View: Grade III Tube type: Oral Tube size: 7.5 mm Number of attempts: 1 Airway Equipment and Method: Video-laryngoscopy and Stylet Placement Confirmation: ETT inserted through vocal cords under direct vision, positive ETCO2, CO2 detector and breath sounds checked- equal and bilateral Secured at: 24 (OETT positioned 24 cm at lower lip.) cm Tube secured with: Tape Dental Injury: Teeth and Oropharynx as per pre-operative assessment  Comments: Direct laryngoscopy x 2. Anterior view of epiglottis only. Converted to Glidescope. Atraumatic intubation. Lips and remain in preoperative condition.

## 2024-04-16 ENCOUNTER — Encounter (HOSPITAL_COMMUNITY): Payer: Self-pay | Admitting: General Surgery

## 2024-04-18 LAB — SURGICAL PATHOLOGY

## 2024-04-23 ENCOUNTER — Ambulatory Visit (INDEPENDENT_AMBULATORY_CARE_PROVIDER_SITE_OTHER): Admitting: General Surgery

## 2024-04-23 ENCOUNTER — Encounter: Payer: Self-pay | Admitting: General Surgery

## 2024-04-23 DIAGNOSIS — D1721 Benign lipomatous neoplasm of skin and subcutaneous tissue of right arm: Secondary | ICD-10-CM

## 2024-04-23 DIAGNOSIS — Z09 Encounter for follow-up examination after completed treatment for conditions other than malignant neoplasm: Secondary | ICD-10-CM

## 2024-04-23 NOTE — Progress Notes (Signed)
 Subjective:     Edwin Nguyen  Virtual postoperative telephone visit performed.  I was in the office.  Patient states he is doing very well.  He has no complaints. Objective:    There were no vitals taken for this visit.  General:  alert and cooperative  Final pathology revealed lipomas.  No malignancy seen.     Assessment:    Doing well postoperatively.    Plan:   Follow-up here as needed.  As this was a part of the total global surgical fee, this was not a billable visit.  Total telephone time was 1 minute.

## 2024-04-27 ENCOUNTER — Telehealth: Payer: Self-pay | Admitting: *Deleted

## 2024-04-27 NOTE — Telephone Encounter (Signed)
 Surgical Date: 04/15/2024 Procedure: Excision Lipoma, Multiple  Received call from patient [phone]~ telephone.   Patient reports that he had 3 lipomas removed from abdomen. States that one incision appears to be opening up. States that skin surrounding incision is red.   Denies any drainage from incision. Denies fever, chills, N/V, etc.   Requested photo for provider to review.

## 2024-04-28 MED ORDER — SULFAMETHOXAZOLE-TRIMETHOPRIM 800-160 MG PO TABS: 1.0000 | ORAL_TABLET | Freq: Two times a day (BID) | ORAL | 0 refills | Status: AC

## 2024-04-28 NOTE — Telephone Encounter (Signed)
 Discussed with Dr. Mavis.   New orders obtained: Bactrim  DS PO BID x7 days.   Advised to stop by office Thursday or Friday for nurse visit to evaluate, or send picture Thursday/ Friday for provider to review.   Call placed to patient and patient made aware.

## 2024-04-28 NOTE — Addendum Note (Signed)
 Addended by: SAUNDRA TAWNI DEL on: 04/28/2024 12:23 PM   Modules accepted: Orders

## 2024-05-01 ENCOUNTER — Encounter: Payer: Self-pay | Admitting: *Deleted

## 2024-05-01 NOTE — Progress Notes (Signed)
 Surgical Date: 04/15/2024 Procedure: Excision Lipoma, Multiple  Left abdominal upper incision noted to have opened. Patient is currently taking Bactrim  DS.   Patient seen in office for wound check.  Wound depth 0.5 cm, width 1.5 cm. Fibrinous tissue noted at wound edges. No drainage noted. Surrounding skin is not red warm or hard.   Noted internal dissolvable stitches in place.   Advised to continue to keep clean and dry. Applied steri strips to assist in re-approximation of skin edges. Advised that wound will need to heal from the bottom up. Advised to cover with bandage while working to keep debris out of open wound.
